# Patient Record
Sex: Male | Born: 2000
Health system: Southern US, Community
[De-identification: ages and names within clinical notes are randomized; demographics above are authoritative.]

## PROBLEM LIST (undated history)

## (undated) DIAGNOSIS — F320 Major depressive disorder, single episode, mild: Secondary | ICD-10-CM

## (undated) DIAGNOSIS — T7840XA Allergy, unspecified, initial encounter: Secondary | ICD-10-CM

## (undated) DIAGNOSIS — J45909 Unspecified asthma, uncomplicated: Secondary | ICD-10-CM

## (undated) DIAGNOSIS — F419 Anxiety disorder, unspecified: Secondary | ICD-10-CM

## (undated) HISTORY — PX: ESOPHAGOGASTRODUODENOSCOPY: SHX1529

## (undated) HISTORY — DX: Anxiety disorder, unspecified: F41.9

## (undated) HISTORY — PX: WISDOM TOOTH EXTRACTION: SHX21

## (undated) HISTORY — DX: Allergy, unspecified, initial encounter: T78.40XA

## (undated) HISTORY — PX: TYMPANOSTOMY TUBE PLACEMENT: SHX32

## (undated) HISTORY — DX: Major depressive disorder, single episode, mild: F32.0

## (undated) HISTORY — PX: NO PAST SURGERIES: SHX2092

---
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
## 2018-02-04 DIAGNOSIS — R5383 Other fatigue: Secondary | ICD-10-CM | POA: Diagnosis not present

## 2018-02-04 DIAGNOSIS — Z113 Encounter for screening for infections with a predominantly sexual mode of transmission: Secondary | ICD-10-CM | POA: Diagnosis not present

## 2018-02-04 DIAGNOSIS — E669 Obesity, unspecified: Secondary | ICD-10-CM | POA: Diagnosis not present

## 2018-02-04 DIAGNOSIS — F331 Major depressive disorder, recurrent, moderate: Secondary | ICD-10-CM | POA: Diagnosis not present

## 2018-02-04 DIAGNOSIS — H6123 Impacted cerumen, bilateral: Secondary | ICD-10-CM | POA: Diagnosis not present

## 2018-02-04 DIAGNOSIS — R030 Elevated blood-pressure reading, without diagnosis of hypertension: Secondary | ICD-10-CM | POA: Diagnosis not present

## 2018-04-29 ENCOUNTER — Encounter (INDEPENDENT_AMBULATORY_CARE_PROVIDER_SITE_OTHER): Payer: Self-pay | Admitting: Nurse Practitioner

## 2018-05-14 DIAGNOSIS — R030 Elevated blood-pressure reading, without diagnosis of hypertension: Secondary | ICD-10-CM | POA: Diagnosis not present

## 2018-05-14 DIAGNOSIS — E559 Vitamin D deficiency, unspecified: Secondary | ICD-10-CM | POA: Diagnosis not present

## 2018-05-14 DIAGNOSIS — Z13220 Encounter for screening for lipoid disorders: Secondary | ICD-10-CM | POA: Diagnosis not present

## 2018-05-14 DIAGNOSIS — F331 Major depressive disorder, recurrent, moderate: Secondary | ICD-10-CM | POA: Diagnosis not present

## 2018-05-14 DIAGNOSIS — E669 Obesity, unspecified: Secondary | ICD-10-CM | POA: Diagnosis not present

## 2018-05-14 DIAGNOSIS — Z713 Dietary counseling and surveillance: Secondary | ICD-10-CM | POA: Diagnosis not present

## 2018-06-15 IMAGING — DX DG RIBS 2V*L*
3 series · 3 of 3 positions shown · non-contrast
Comparison: August 23, 2005

CLINICAL DATA: Left lower anterior rib pain for 2 weeks.

EXAM:
LEFT RIBS - 2 VIEW

[rib pa]
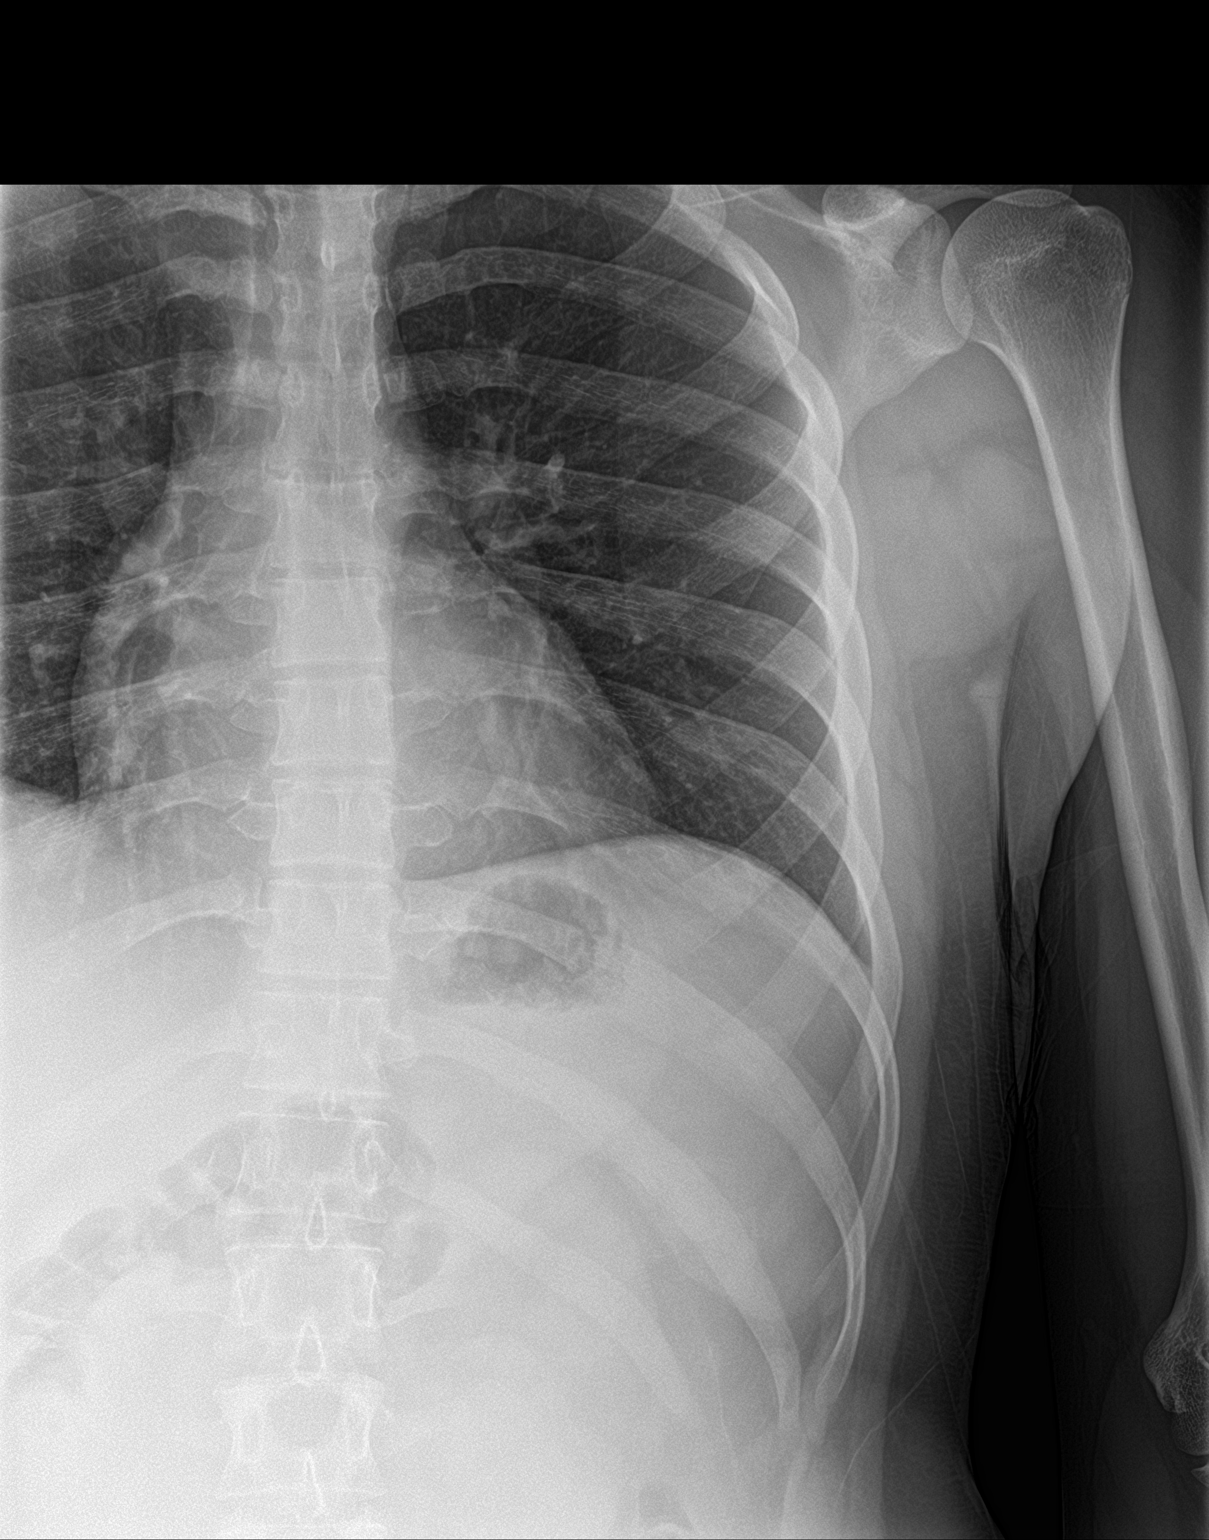

[rib pa obl (1 of 2)]
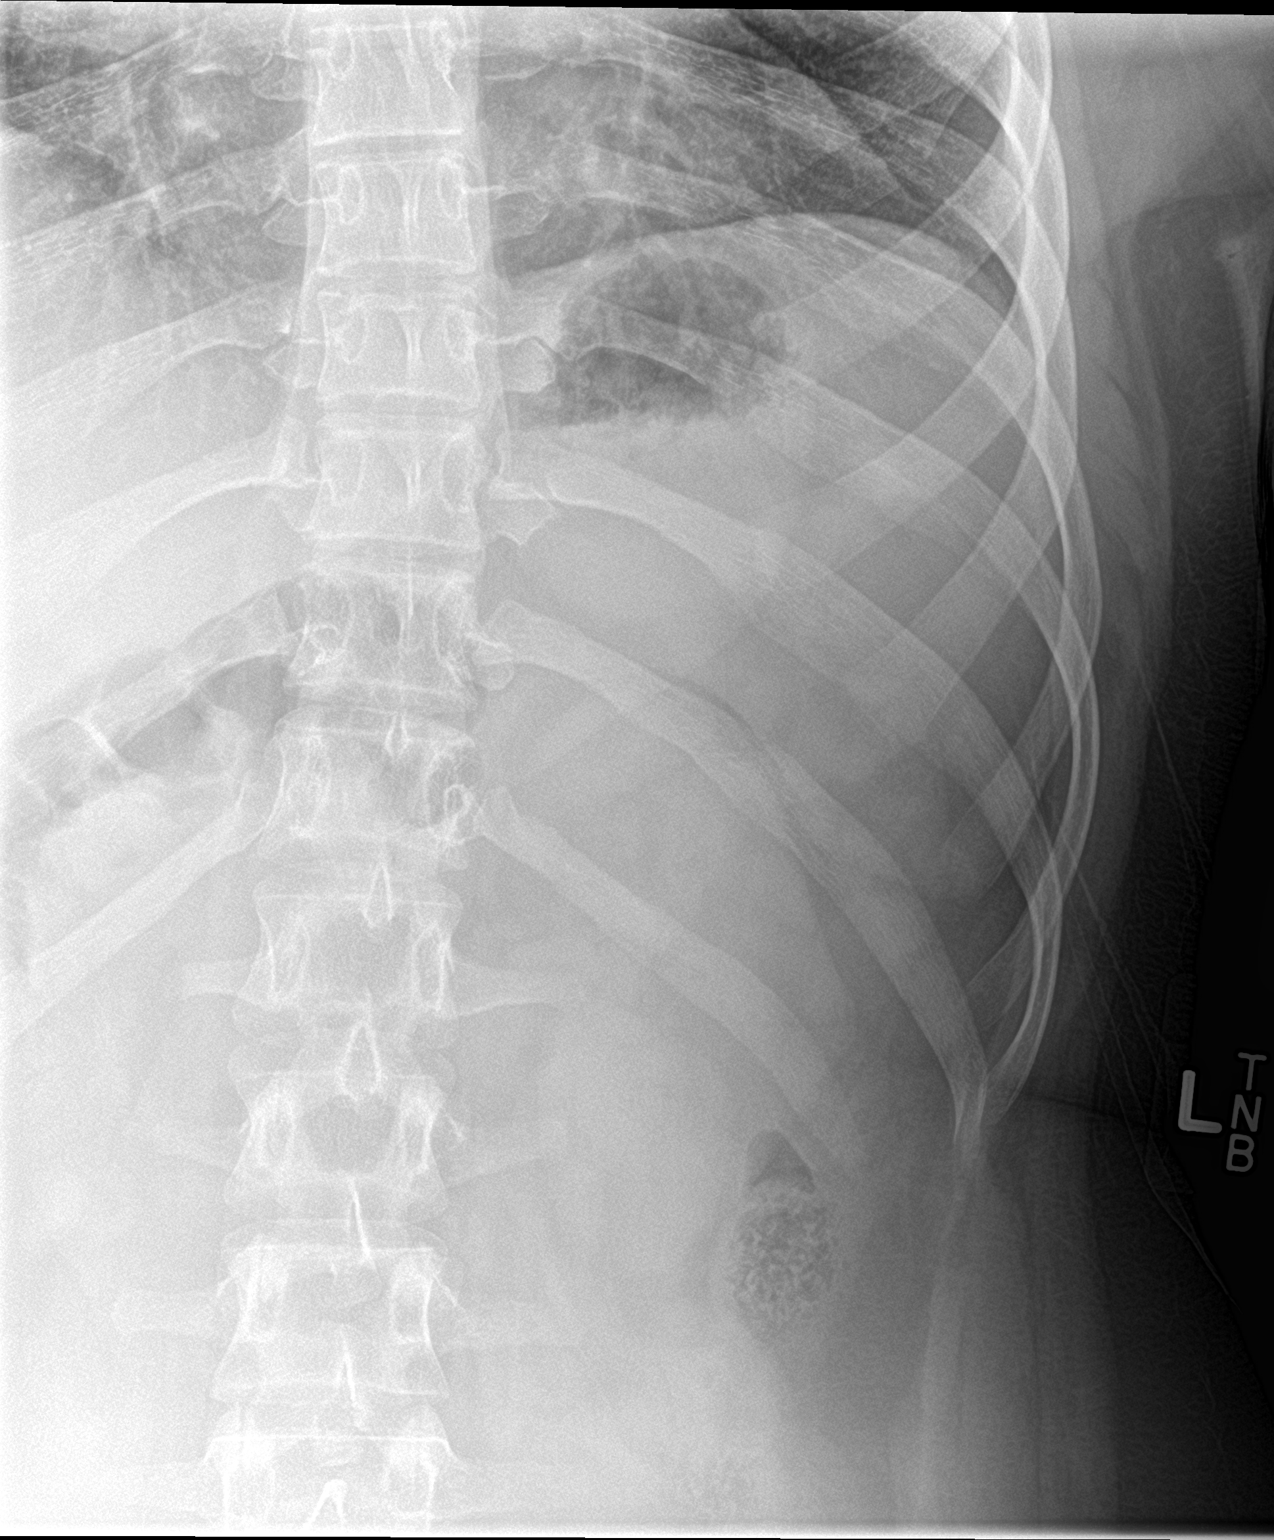

[rib pa obl (2 of 2)]
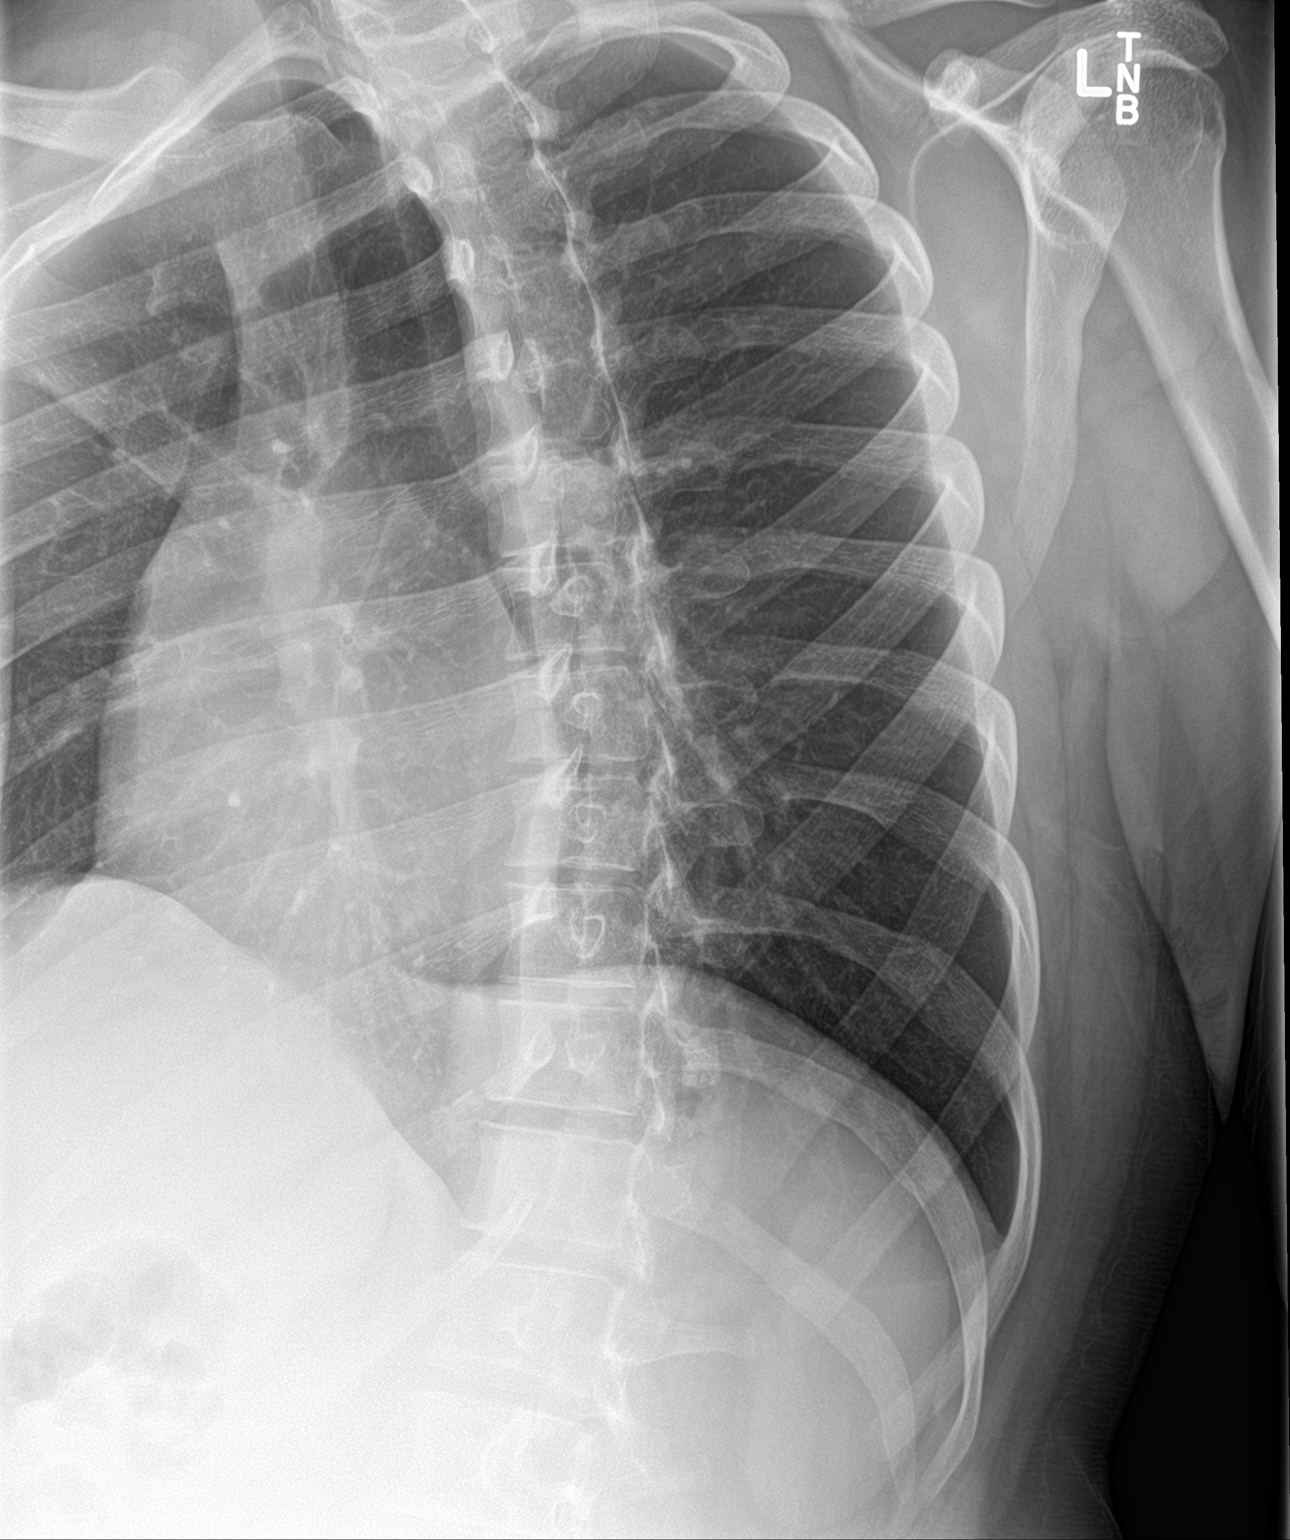

[3 of 3 positions shown; findings below may reference images not displayed]

FINDINGS: No fracture or other bone lesions are seen involving the ribs.
IMPRESSION: Negative.

## 2018-08-13 DIAGNOSIS — E559 Vitamin D deficiency, unspecified: Secondary | ICD-10-CM | POA: Diagnosis not present

## 2018-09-06 ENCOUNTER — Ambulatory Visit: Payer: Medicaid Other | Admitting: Family Medicine

## 2018-09-10 ENCOUNTER — Encounter (INDEPENDENT_AMBULATORY_CARE_PROVIDER_SITE_OTHER): Payer: Self-pay

## 2018-09-10 ENCOUNTER — Ambulatory Visit (INDEPENDENT_AMBULATORY_CARE_PROVIDER_SITE_OTHER): Payer: Self-pay | Admitting: Family Medicine

## 2018-09-10 ENCOUNTER — Other Ambulatory Visit: Payer: Self-pay

## 2018-09-10 ENCOUNTER — Encounter: Payer: Self-pay | Admitting: Family Medicine

## 2018-09-10 VITALS — BP 128/87 | HR 96 | Temp 99.0°F | Resp 12 | Ht 66.0 in | Wt 187.0 lb

## 2018-09-10 DIAGNOSIS — F819 Developmental disorder of scholastic skills, unspecified: Secondary | ICD-10-CM

## 2018-09-10 DIAGNOSIS — T148XXA Other injury of unspecified body region, initial encounter: Secondary | ICD-10-CM

## 2018-09-10 DIAGNOSIS — J3081 Allergic rhinitis due to animal (cat) (dog) hair and dander: Secondary | ICD-10-CM

## 2018-09-10 DIAGNOSIS — K5900 Constipation, unspecified: Secondary | ICD-10-CM

## 2018-09-10 DIAGNOSIS — J4530 Mild persistent asthma, uncomplicated: Secondary | ICD-10-CM

## 2018-09-10 MED ORDER — LORATADINE 10 MG PO TABS: 10.0000 mg | ORAL_TABLET | Freq: Every day | ORAL | 1 refills | Status: DC

## 2018-09-10 MED ORDER — TETANUS-DIPHTH-ACELL PERTUSSIS 5-2.5-18.5 LF-MCG/0.5 IM SUSP
0.5000 mL | Freq: Once | INTRAMUSCULAR | Status: AC
  Administered 2018-09-10: 0.5 mL via INTRAMUSCULAR

## 2018-09-10 MED ORDER — POLYETHYLENE GLYCOL 3350 17 GM/SCOOP PO POWD: 1.0000 | Freq: Once | ORAL | Status: AC

## 2018-09-10 NOTE — Progress Notes (Signed)
Subjective:     Patient ID: Jimmy Landry, male   DOB: 2000/04/23, 18 y.o.   MRN: [id number]  Jimmy Landry presents for New Patient (Initial Visit) (establish care)  Jimmy Landry is a 18 year old male who presents today to reestablish care.  Is present today with his mother.  He is okay to speak with her about his health care and request that she stay in the room.  Has been previous patient of Dr. Lindaann SloughNelson's team.  Recently was seen by Dr. Karilyn CotaGosrani and referred back over here for establishment of care back in [place].  He has been treated for multiple medical conditions including anxiety, depression, asthma and allergies among others.  Currently reports that he is taking stool softeners, Prozac, eardrops for earwax softening, still has inhaler but is not using it frequently maybe once or twice a year.  Stopped taking his allergy shots and is no longer going to an allergist does not want to go back to an allergist.  Reports that he is also not taking an allergy medication.  But possibly would be interested in going back on allergy medication.  Graduated from high school late as he has a learning disability.  He was accommodated while he was going through school.  Did not really have any goals for future endeavors coming out of high school.  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  Reports that his immunizations are up-to-date but that he recently had a fall at work where he landed on metal and needs to get his tetanus shot today.  Would like to get the flu shot in the near future once we have it back in the office.  Family history is significant for asthma, allergies, COPD, lung cancer, depression, diabetes heart disease and others. Denies smoking or alcohol use.  Denies sexual intercourse at this time.  Denies having any drug use illicit and or taking prescription drugs that were not prescribed to him.  Reports living with his mother and older brother.  They have 2 pets, dogs  peanut and raisin.  He enjoys watching TV, video gaming, music.  Reports that he does not have much time for social life.  As he works night shift comes home and sleeps.  Not really been hanging out with.  Reports that he isolates a little bit.  He thinks this is mostly related to his job.  Because he did have friends in school.  Reports that he eats a diet that has vegetables, fruits, meats.  Reports that he is not really been on snacking.  Reports that he drinks 5-hour energy drinks.  1 bottle lasts about 3 days.  Does report that he drinks soda.  Reports that he drinks plenty water which he thinks he gets 8 cups a day.  Reports wearing a seatbelt.  Reports having smoke detectors at home.  Reports not using his phone while driving.  Overall no concerns today in the office.  Just would like to be reestablished with this.   Today patient denies signs and symptoms of COVID 19 infection including fever, chills, cough, shortness of breath, and headache.  Past Medical, Surgical, Social History, Allergies, and Medications have been Reviewed.  Past Medical History:  Diagnosis Date  . Anxiety   . Asthma   . Depression, major, single episode, mild (HCC) 12/21/2016   Past Surgical History:  Procedure Laterality Date  . NO PAST SURGERIES    . TYMPANOSTOMY TUBE PLACEMENT  Social History   Socioeconomic History  . Marital status: Single    Spouse name: Not on file  . Number of children: Not on file  . Years of education: Not on file  . Highest education level: Not on file  Occupational History  . Occupation: Consulting civil engineerstudent  Social Needs  . Financial resource strain: Not on file  . Food insecurity    Worry: Not on file    Inability: Not on file  . Transportation needs    Medical: Not on file    Non-medical: Not on file  Tobacco Use  . Smoking status: Never Smoker  . Smokeless tobacco: Never Used  Substance and Sexual Activity  . Alcohol use: No  . Drug use: No  . Sexual activity: Not  Currently  Lifestyle  . Physical activity    Days per week: Not on file    Minutes per session: Not on file  . Stress: Not on file  Relationships  . Social Musicianconnections    Talks on phone: Not on file    Gets together: Not on file    Attends religious service: Not on file    Active member of club or organization: Not on file    Attends meetings of clubs or organizations: Not on file    Relationship status: Not on file  . Intimate partner violence    Fear of current or ex partner: Not on file    Emotionally abused: Not on file    Physically abused: Not on file    Forced sexual activity: Not on file  Other Topics Concern  . Not on file  Social History Narrative   Lives with parents and older brother Alinda Moneyony    Outpatient Encounter Medications as of 09/10/2018  Medication Sig  . albuterol (VENTOLIN HFA) 108 (90 Base) MCG/ACT inhaler Inhale 2 puffs into the lungs every 4 (four) hours as needed for wheezing or shortness of breath.  . carbamide peroxide (DEBROX) 6.5 % OTIC solution Place 5 drops into both ears 2 (two) times daily.  Marland Kitchen. FLUoxetine (PROZAC) 20 MG capsule Take 60 mg by mouth daily.   Marland Kitchen. VITAMIN D PO Take 10,000 Units by mouth daily.  . [DISCONTINUED] cetirizine (ZYRTEC) 10 MG tablet TAKE ONE TABLET BY MOUTH ONCE DAILY. (Patient not taking: Reported on 09/10/2018)  . [DISCONTINUED] cetirizine (ZYRTEC) 10 MG tablet TAKE ONE TABLET BY MOUTH ONCE DAILY. (Patient not taking: Reported on 09/10/2018)  . [DISCONTINUED] hydrOXYzine (ATARAX/VISTARIL) 25 MG tablet Take 25 mg 3 (three) times daily as needed by mouth.  . [DISCONTINUED] montelukast (SINGULAIR) 10 MG tablet TAKE (1) TABLET BY MOUTH AT BEDTIME. (Patient not taking: Reported on 09/10/2018)   No facility-administered encounter medications on file as of 09/10/2018.    No Known Allergies  Review of Systems  Constitutional: Negative for chills and fever.  HENT: Negative.   Eyes: Negative.   Respiratory: Negative.   Cardiovascular:  Negative.   Gastrointestinal: Negative.   Endocrine: Negative.   Genitourinary: Negative.   Musculoskeletal: Negative.   Skin: Negative.   Allergic/Immunologic: Negative.   Neurological: Negative.   Hematological: Negative.   Psychiatric/Behavioral: Negative.   All other systems reviewed and are negative.      Objective:     BP 128/87   Pulse 96   Temp 99 F (37.2 C) (Oral)   Resp 12   Ht 5\' 6"  (1.676 m)   Wt 187 lb (84.8 kg)   SpO2 96%   BMI 30.18 kg/m  Physical Exam Vitals signs and nursing note reviewed.  Constitutional:      Appearance: Normal appearance. He is well-developed and well-groomed. He is obese.  HENT:     Head: Normocephalic and atraumatic.     Right Ear: External ear normal.     Left Ear: External ear normal.     Nose: Nose normal.     Mouth/Throat:     Mouth: Mucous membranes are moist.     Pharynx: Oropharynx is clear.  Eyes:     General:        Right eye: No discharge.        Left eye: No discharge.     Conjunctiva/sclera: Conjunctivae normal.  Neck:     Musculoskeletal: Normal range of motion and neck supple.  Cardiovascular:     Rate and Rhythm: Normal rate and regular rhythm.     Pulses: Normal pulses.     Heart sounds: Normal heart sounds.  Pulmonary:     Effort: Pulmonary effort is normal.     Breath sounds: Normal breath sounds.  Musculoskeletal: Normal range of motion.  Skin:    General: Skin is warm.  Neurological:     General: No focal deficit present.     Mental Status: He is alert and oriented to person, place, and time.  Psychiatric:        Attention and Perception: Attention normal.        Mood and Affect: Mood normal.        Behavior: Behavior is cooperative.     Comments: Poor eye contact Paucity of speech         Assessment and Plan             1. Constipation, unspecified constipation type Wants to have miralax again as he reports not being "where he needs to be" with  Bathroom use. He takes stool  softeners but the don't help much. Encouraged to drink more water and increase fiber in diet.  Reviewed side effects, risks and benefits of medication.   Patient acknowledged agreement and understanding of the plan.    - polyethylene glycol powder (GLYCOLAX/MIRALAX) container 255 g  2. Chronic allergic rhinitis due to animal hair and dander Advise to start taking allergy medication to make sure he does not have Asthma flares related to uncontrolled allergies. Is not seeing the allergist anymore at this time. Declines wanting to go back.  Reviewed side effects, risks and benefits of medication.   Patient acknowledged agreement and understanding of the plan.    - loratadine (CLARITIN) 10 MG tablet; Take 1 tablet (10 mg total) by mouth daily.  Dispense: 90 tablet; Refill: 1  3. Cut, accidental Fall on metal at work. No signs of infection. Provided with Tdap in office today.  Patient was educated on the recommendation for  Tetanus TDAP vaccine. After obtaining informed consent, the vaccine was administered no adverse effects noted at time of administration. Patient provided with education on arm soreness and use of tylenol or ibuprofen (if safe) for this. Encourage to use the arm vaccine was given in to help reduce the soreness. Patient educated on the signs of a reaction to the vaccine and advised to contact the office should these occur.    - Tdap (BOOSTRIX) injection 0.5 mL  4. Learning disability Graduated HS, is working in a warehouse. Lack of social interaction-delay in communication. Overall is functioning well outside of school at this time.  5. Mild persistent asthma, uncomplicated Controlled, reports not  needing inhaler but a few times a year. Advised to take allergy medications to prevent flares of asthma especially with pets in the home and fall coming. Patient acknowledged agreement and understanding of the plan.     Follow Up: 6 months or as needed  Perlie Mayo, DNP, AGNP-BC [place], [address], [place] [address] Office Hours: Mon-Thurs 8 am-5 pm; Fri 8 am-12 pm Office Phone:  [phone]  Office Fax: [phone]

## 2018-09-10 NOTE — Patient Instructions (Signed)
Thank you for coming into the office today. I appreciate the opportunity to provide you with the care for your health and wellness. Today we discussed: overall health and establish care  Follow Up: 6 months (can call if need sooner)  No labs today  Continue current medications. Please let us know when you need refills.  Start using Miralax daily. Make sure you are getting enough fiber in your diet. Drink plenty of water as well.  Today you have a vaccine, your arm might get sore, you can use ice or heat pad and or tylenol for pain. Using the arm will help reduce the pain.  Please continue to practice social distancing to keep you, your family, and our community safe.  If you must go out, please wear a Mask and practice good handwashing.  WASH YOUR HANDS WELL AND FREQUENTLY. AVOID TOUCHING YOUR FACE, UNLESS YOUR HANDS ARE FRESHLY WASHED.  GET FRESH AIR DAILY. STAY HYDRATED WITH WATER.   It was a pleasure to see you and I look forward to continuing to work together on your health and well-being. Please do not hesitate to call the office if you need care or have questions about your care.  Have a wonderful day and week.  With Gratitude,  Tereasa CoopHannah Adeline Petitfrere, DNP, AGNP-BC   Fiber Content in Foods  See the following list for the dietary fiber content of some common foods. High-fiber foods High-fiber foods contain 4 grams or more (4g or more) of fiber per serving. They include:  Artichoke (fresh) - 1 medium has 10.3g of fiber.  Baked beans, plain or vegetarian (canned) -  cup has 5.2g of fiber.  Blackberries or raspberries (fresh) -  cup has 4g of fiber.  Bran cereal -  cup has 8.6g of fiber.  Bulgur (cooked) -  cup has 4g of fiber.  Kidney beans (canned) -  cup has 6.8g of fiber.  Lentils (cooked) -  cup has 7.8g of fiber.  Pear (fresh) - 1 medium has 5.1g of fiber.  Peas (frozen) -  cup has 4.4g of fiber.  Pinto beans (canned) -  cup has 5.5g of fiber.   Pinto beans (dried and cooked) -  cup has 7.7g of fiber.  Potato with skin (baked) - 1 medium has 4.4g of fiber.  Quinoa (cooked) -  cup has 5g of fiber.  Soybeans (canned, frozen, or fresh) -  cup has 5.1g of fiber. Moderate-fiber foods Moderate-fiber foods contain 1-4 grams (1-4g) of fiber per serving. They include:  Almonds - 1 oz. has 3.5g of fiber.  Apple with skin - 1 medium has 3.3g of fiber.  Applesauce, sweetened -  cup has 1.5g of fiber.  Bagel, plain - one 4-inch (10-cm) bagel has 2g of fiber.  Banana - 1 medium has 3.1g of fiber.  Broccoli (cooked) -  cup has 2.5g of fiber.  Carrots (cooked) -  cup has 2.3g of fiber.  Corn (canned or frozen) -  cup has 2.1g of fiber.  Corn tortilla - one 6-inch (15-cm) tortilla has 1.5g of fiber.  Green beans (canned) -  cup has 2g of fiber.  Instant oatmeal -  cup has about 2g of fiber.  Long-grain brown rice (cooked) - 1 cup has 3.5g of fiber.  Macaroni, enriched (cooked) - 1 cup has 2.5g of fiber.  Melon - 1 cup has 1.4g of fiber.  Multigrain cereal -  cup has about 2-4g of fiber.  Orange - 1 small has 3.1g of  fiber.  Potatoes, mashed -  cup has 1.6g of fiber.  Raisins - 1/4 cup has 1.6g of fiber.  Squash -  cup has 2.9g of fiber.  Sunflower seeds -  cup has 1.1g of fiber.  Tomato - 1 medium has 1.5g of fiber.  Vegetable or soy patty - 1 has 3.4g of fiber.  Whole-wheat bread - 1 slice has 2g of fiber.  Whole-wheat spaghetti -  cup has 3.2g of fiber. Low-fiber foods Low-fiber foods contain less than 1 gram (less than 1g) of fiber per serving. They include:  Egg - 1 large.  Flour tortilla - one 6-inch (15-cm) tortilla.  Fruit juice -  cup.  Lettuce - 1 cup.  Meat, poultry, or fish - 1 oz.  Milk - 1 cup.  Spinach (raw) - 1 cup.  White bread - 1 slice.  White rice -  cup.  Yogurt -  cup. Actual amounts of fiber in foods may be different depending on processing. Talk with your  dietitian about how much fiber you need in your diet. This information is not intended to replace advice given to you by your health care provider. Make sure you discuss any questions you have with your health care provider. Document Released: 06/11/2006 Document Revised: 09/16/2015 Document Reviewed: 03/18/2015 Elsevier Patient Education  2020 [employer].

## 2018-10-07 ENCOUNTER — Telehealth: Payer: Self-pay | Admitting: Family Medicine

## 2018-10-07 ENCOUNTER — Other Ambulatory Visit: Payer: Self-pay

## 2018-10-07 MED ORDER — FLUOXETINE HCL 20 MG PO CAPS: 60.0000 mg | ORAL_CAPSULE | Freq: Every day | ORAL | 0 refills | Status: DC

## 2018-10-07 NOTE — Telephone Encounter (Signed)
Needs Prozac sent in to [facility]--

## 2018-10-07 NOTE — Telephone Encounter (Signed)
Medication refilled

## 2018-11-11 ENCOUNTER — Ambulatory Visit (INDEPENDENT_AMBULATORY_CARE_PROVIDER_SITE_OTHER): Payer: Self-pay | Admitting: Internal Medicine

## 2018-11-15 ENCOUNTER — Other Ambulatory Visit: Payer: Self-pay

## 2018-11-15 DIAGNOSIS — Z20822 Contact with and (suspected) exposure to covid-19: Secondary | ICD-10-CM

## 2018-11-15 DIAGNOSIS — Z20828 Contact with and (suspected) exposure to other viral communicable diseases: Secondary | ICD-10-CM | POA: Diagnosis not present

## 2018-11-16 LAB — NOVEL CORONAVIRUS, NAA: SARS-CoV-2, NAA: NOT DETECTED

## 2018-11-18 ENCOUNTER — Telehealth: Payer: Self-pay | Admitting: Family Medicine

## 2018-11-18 NOTE — Telephone Encounter (Signed)
Patient is calling to receive his negative COVID results. Patient expressed understanding. °

## 2018-12-18 ENCOUNTER — Encounter: Payer: Self-pay | Admitting: Family Medicine

## 2018-12-18 ENCOUNTER — Ambulatory Visit: Payer: BC Managed Care – PPO | Admitting: Family Medicine

## 2018-12-18 ENCOUNTER — Other Ambulatory Visit: Payer: Self-pay

## 2018-12-18 VITALS — BP 126/68 | HR 97 | Temp 98.6°F | Resp 15 | Ht 66.0 in | Wt 183.1 lb

## 2018-12-18 DIAGNOSIS — H6123 Impacted cerumen, bilateral: Secondary | ICD-10-CM | POA: Diagnosis not present

## 2018-12-18 DIAGNOSIS — Z23 Encounter for immunization: Secondary | ICD-10-CM

## 2018-12-18 DIAGNOSIS — H9203 Otalgia, bilateral: Secondary | ICD-10-CM

## 2018-12-18 MED ORDER — CARBAMIDE PEROXIDE 6.5 % OT SOLN: 5.0000 [drp] | Freq: Two times a day (BID) | OTIC | 1 refills | Status: DC

## 2018-12-18 NOTE — Patient Instructions (Addendum)
I appreciate the opportunity to provide you with care for your health and wellness. Today we discussed: ear pain/ear wax build up  Follow up:   Appt next Tuesday   No labs   Referral for ear doctor made today. If you get in with them before next week appt with me,  you can cancel the appt with me.  Debrox daily for the next 4-5 days. Do not push anything into ears. After wearing ear plugs, use debrox.  Let ear drain naturally  I hope you have a wonderful, happy, safe, and healthy Holiday Season!  Please continue to practice social distancing to keep you, your family, and our community safe.  If you must go out, please wear a mask and practice good handwashing.  It was a pleasure to see you and I look forward to continuing to work together on your health and well-being. Please do not hesitate to call the office if you need care or have questions about your care.  Have a wonderful day and week. With Gratitude, Cherly Beach, DNP, AGNP-BC    Carbamide Peroxide ear solution What is this medicine? CARBAMIDE PEROXIDE (CAR bah mide per OX ide) is used to soften and help remove ear wax. This medicine may be used for other purposes; ask your health care provider or pharmacist if you have questions. COMMON BRAND NAME(S): Auro Ear, Auro Earache Relief, Debrox, Ear Drops, Ear Wax Removal, Ear Wax Remover, Earwax Treatment, Murine, Thera-Ear What should I tell my health care provider before I take this medicine? They need to know if you have any of these conditions:  dizziness  ear discharge  ear pain, irritation or rash  infection  perforated eardrum (hole in eardrum)  an unusual or allergic reaction to carbamide peroxide, glycerin, hydrogen peroxide, other medicines, foods, dyes, or preservatives  pregnant or trying to get pregnant  breast-feeding How should I use this medicine? This medicine is only for use in the outer ear canal. Follow the directions carefully. Wash  hands before and after use. The solution may be warmed by holding the bottle in the hand for 1 to 2 minutes. Lie with the affected ear facing upward. Place the proper number of drops into the ear canal. After the drops are instilled, remain lying with the affected ear upward for 5 minutes to help the drops stay in the ear canal. A cotton ball may be gently inserted at the ear opening for no longer than 5 to 10 minutes to ensure retention. Repeat, if necessary, for the opposite ear. Do not touch the tip of the dropper to the ear, fingertips, or other surface. Do not rinse the dropper after use. Keep container tightly closed. Talk to your pediatrician regarding the use of this medicine in children. While this drug may be used in children as young as 12 years for selected conditions, precautions do apply. Overdosage: If you think you have taken too much of this medicine contact a poison control center or emergency room at once. NOTE: This medicine is only for you. Do not share this medicine with others. What if I miss a dose? If you miss a dose, use it as soon as you can. If it is almost time for your next dose, use only that dose. Do not use double or extra doses. What may interact with this medicine? Interactions are not expected. Do not use any other ear products without asking your doctor or health care professional. This list may not describe all possible interactions.  Give your health care provider a list of all the medicines, herbs, non-prescription drugs, or dietary supplements you use. Also tell them if you smoke, drink alcohol, or use illegal drugs. Some items may interact with your medicine. What should I watch for while using this medicine? This medicine is not for long-term use. Do not use for more than 4 days without checking with your health care professional. Contact your doctor or health care professional if your condition does not start to get better within a few days or if you notice  burning, redness, itching or swelling. What side effects may I notice from receiving this medicine? Side effects that you should report to your doctor or health care professional as soon as possible:  allergic reactions like skin rash, itching or hives, swelling of the face, lips, or tongue  burning, itching, and redness  worsening ear pain  rash Side effects that usually do not require medical attention (report to your doctor or health care professional if they continue or are bothersome):  abnormal sensation while putting the drops in the ear  temporary reduction in hearing (but not complete loss of hearing) This list may not describe all possible side effects. Call your doctor for medical advice about side effects. You may report side effects to FDA at 1-800-FDA-1088. Where should I keep my medicine? Keep out of the reach of children. Store at room temperature between 15 and 30 degrees C (59 and 86 degrees F) in a tight, light-resistant container. Keep bottle away from excessive heat and direct sunlight. Throw away any unused medicine after the expiration date. NOTE: This sheet is a summary. It may not cover all possible information. If you have questions about this medicine, talk to your doctor, pharmacist, or health care provider.  2020 Elsevier/Gold Standard (2007-05-07 14:00:02)

## 2018-12-18 NOTE — Progress Notes (Signed)
Subjective:     Patient ID: Jimmy Landry, male   DOB: April 08, 2000, 18 y.o.   MRN: [id number]  Jimmy Landry presents for Ear Pain (right ear)  Jimmy Landry is a 18 year old male patient with allergies, anxiety, asthma, major depression.  Presents today with right ear squeaking.  Reported it started about 6 to 7 months ago.  Mostly on the right side.  It squeaks when he rubs it or itches it.  Nothing is relieved that nothing aggravates it.  He reports that it is pretty constant.  When it does hurt it is a 3 out of 10.  He wears hearing plugs daily for work.  Used to use Debrox to help keep his ears clean but has not been using it because he did not think it was very helpful.  Has had to have his ears flushed in the past.  Reports that there is some itching which is having noticed that the squeaking was occurring.  Some mild hearing loss on both sides.  Predominantly the right side bothers him most.  Today patient denies signs and symptoms of COVID 19 infection including fever, chills, cough, shortness of breath, and headache.  Past Medical, Surgical, Social History, Allergies, and Medications have been Reviewed.  Past Medical History:  Diagnosis Date  . Allergy   . Anxiety   . Asthma   . Depression, major, single episode, mild ([place]) 12/21/2016   Past Surgical History:  Procedure Laterality Date  . ESOPHAGOGASTRODUODENOSCOPY     for penny removal   . NO PAST SURGERIES    . TYMPANOSTOMY TUBE PLACEMENT    . WISDOM TOOTH EXTRACTION     Social History   Socioeconomic History  . Marital status: Single    Spouse name: Not on file  . Number of children: Not on file  . Years of education: Not on file  . Highest education level: High school graduate  Occupational History  . Occupation: Ship broker  Social Needs  . Financial resource strain: Not hard at all  . Food insecurity    Worry: Never true    Inability: Never true  . Transportation needs    Medical: No    Non-medical: No   Tobacco Use  . Smoking status: Never Smoker  . Smokeless tobacco: Never Used  Substance and Sexual Activity  . Alcohol use: No  . Drug use: No  . Sexual activity: Not Currently  Lifestyle  . Physical activity    Days per week: 0 days    Minutes per session: 0 min  . Stress: Not at all  Relationships  . Social Herbalist on phone: Once a week    Gets together: Never    Attends religious service: Never    Active member of club or organization: No    Attends meetings of clubs or organizations: Never    Relationship status: Never married  . Intimate partner violence    Fear of current or ex partner: No    Emotionally abused: No    Physically abused: No    Forced sexual activity: No  Other Topics Concern  . Not on file  Social History Narrative   Lives mom and older brother Jimmy Landry   Peanut-weenie/chi   Raisin-weenie/chi      Enjoy: watching tv, video games, music      Diet: veggies, fruits, meat-chicken, some red meat. Not big on snacks   Caffeine: 5 hour energy drinks for work (one bottle for  3 days), coke, sprite   Water: 8 cups day       Wears seat belt    Wears sun protection   Smoke detectors at home   Does not use phone while driving    Outpatient Encounter Medications as of 12/18/2018  Medication Sig  . albuterol (VENTOLIN HFA) 108 (90 Base) MCG/ACT inhaler Inhale 2 puffs into the lungs every 4 (four) hours as needed for wheezing or shortness of breath.  . carbamide peroxide (DEBROX) 6.5 % OTIC solution Place 5 drops into both ears 2 (two) times daily.  Marland Kitchen. FLUoxetine (PROZAC) 20 MG capsule Take 3 capsules (60 mg total) by mouth daily.  Marland Kitchen. loratadine (CLARITIN) 10 MG tablet Take 1 tablet (10 mg total) by mouth daily.  Marland Kitchen. VITAMIN D PO Take 10,000 Units by mouth daily.   Facility-Administered Encounter Medications as of 12/18/2018  Medication  . polyethylene glycol powder (GLYCOLAX/MIRALAX) container 255 g   No Known Allergies  Review of Systems   Constitutional: Negative for chills and fever.  HENT: Negative.        Loss of hearing-"rat sound in ear" makes a lot of squeaking  Eyes: Negative.   Respiratory: Negative.   Cardiovascular: Negative.   Gastrointestinal: Negative.   Endocrine: Negative.   Genitourinary: Negative.   Musculoskeletal: Negative.   Skin: Negative.   Allergic/Immunologic: Negative.   Neurological: Negative.   Hematological: Negative.   Psychiatric/Behavioral: Negative.   All other systems reviewed and are negative.      Objective:     BP 126/68   Pulse 97   Temp 98.6 F (37 C) (Oral)   Resp 15   Ht 5\' 6"  (1.676 m)   Wt 183 lb 1.3 oz (83 kg)   SpO2 97%   BMI 29.55 kg/m   Physical Exam Vitals signs and nursing note reviewed.  Constitutional:      Appearance: Normal appearance. He is well-developed and well-groomed. He is obese.  HENT:     Head: Normocephalic and atraumatic.     Right Ear: External ear normal. Decreased hearing noted. Tenderness present. There is impacted cerumen.     Left Ear: External ear normal. Decreased hearing noted. Tenderness present. There is impacted cerumen.     Ears:     Comments: Erythematous canals bilaterally    Nose: Nose normal.     Mouth/Throat:     Mouth: Mucous membranes are moist.     Pharynx: Oropharynx is clear.  Eyes:     General:        Right eye: No discharge.        Left eye: No discharge.     Conjunctiva/sclera: Conjunctivae normal.  Neck:     Musculoskeletal: Normal range of motion and neck supple.  Cardiovascular:     Rate and Rhythm: Normal rate and regular rhythm.     Pulses: Normal pulses.     Heart sounds: Normal heart sounds.  Pulmonary:     Effort: Pulmonary effort is normal.     Breath sounds: Normal breath sounds.  Musculoskeletal: Normal range of motion.  Skin:    General: Skin is warm.  Neurological:     General: No focal deficit present.     Mental Status: He is alert and oriented to person, place, and time.   Psychiatric:        Attention and Perception: Attention normal.        Mood and Affect: Mood normal.        Speech: Speech  normal.        Behavior: Behavior normal. Behavior is cooperative.        Thought Content: Thought content normal.        Cognition and Memory: Cognition normal.        Judgment: Judgment normal.   Procedure: Bilateral ear flushing.  Consent obtained verbally. Both ears are impacted with cerumen.  Flushed with irrigation of 60 mL syringe with warm water and peroxide diluted.  Success with removal of some cerumen no inability to disimpact closest to tympanic membrane.  Both canals are red and tender.  Hearing slightly improved post irrigation.     Assessment and Plan        1. Bilateral impacted cerumen Significant impaction bilaterally. Uses ear plugs, has not been using Debrox as ordered, did not think it helped. Encouraged to go back to using it daily and come back in 1 week to see if wax as come out on its own or if we can flush it out at that time. Referral for ENT made as well to help with suction if flushing can not clear the canal fully.  Appreciate collaboration, please let PCP know if you need anything from me.  - Ambulatory referral to ENT - carbamide peroxide (DEBROX) 6.5 % OTIC solution; Place 5 drops into both ears 2 (two) times daily.  Dispense: 15 mL; Refill: 1  2. Ear pain, bilateral Mostly from impaction, relieved some, but still impacted closet to TM.  3. Hearing loss of both ears due to cerumen impaction Improved some post flush, but still no visual of TM.  4. Need for immunization against influenza Patient was educated on the recommendation for flu vaccine. After obtaining informed consent, the vaccine was administered no adverse effects noted at time of administration. Patient provided with education on arm soreness and use of tylenol or ibuprofen (if safe) for this. Encourage to use the arm vaccine was given in to help reduce the soreness.  Patient educated on the signs of a reaction to the vaccine and advised to contact the office should these occur.   - Flu Vaccine QUAD 36+ mos IM   Follow Up: 12/26/2018   Freddy Finner, DNP, AGNP-BC [facility] [address], [address] [place], [place] [address] Office Hours: Mon-Thurs 8 am-5 pm; Fri 8 am-12 pm Office Phone:  [phone]  Office Fax: [phone]

## 2018-12-26 ENCOUNTER — Encounter: Payer: Self-pay | Admitting: Family Medicine

## 2018-12-26 ENCOUNTER — Other Ambulatory Visit: Payer: Self-pay

## 2018-12-26 ENCOUNTER — Ambulatory Visit: Payer: BC Managed Care – PPO | Admitting: Family Medicine

## 2018-12-26 VITALS — BP 125/76 | HR 86 | Temp 98.5°F | Resp 15 | Ht 66.0 in | Wt 186.1 lb

## 2018-12-26 DIAGNOSIS — L239 Allergic contact dermatitis, unspecified cause: Secondary | ICD-10-CM | POA: Diagnosis not present

## 2018-12-26 DIAGNOSIS — H6123 Impacted cerumen, bilateral: Secondary | ICD-10-CM

## 2018-12-26 MED ORDER — TRIAMCINOLONE ACETONIDE 0.1 % EX CREA: 1.0000 "application " | TOPICAL_CREAM | Freq: Two times a day (BID) | CUTANEOUS | 0 refills | Status: DC

## 2018-12-26 NOTE — Patient Instructions (Addendum)
  I appreciate the opportunity to provide you with care for your health and wellness. Today we discussed: Earwax buildup, rash  Follow up: 2 weeks in office  No labs or referrals today  I sent in a cream to be applied twice daily to the thumbs and forearm to help clear up the rash.  Wear long sleeves and if you get wet immediately wash the area that becomes saturated with any fluid.  Continue to use Debrox daily.   You can use a small half capful of peroxide weekly to help with the buildup of wax. Avoid sticking anything into the ear canal.  I hope you have a wonderful, happy, safe, and healthy Holiday Season!  Please continue to practice social distancing to keep you, your family, and our community safe.  If you must go out, please wear a mask and practice good handwashing.  It was a pleasure to see you and I look forward to continuing to work together on your health and well-being. Please do not hesitate to call the office if you need care or have questions about your care.  Have a wonderful day and week. With Gratitude, Cherly Beach, DNP, AGNP-BC

## 2018-12-26 NOTE — Progress Notes (Signed)
Subjective:     Patient ID: Jimmy Landry, male   DOB: Jun 05, 2000, 18 y.o.   MRN: [id number]  Jimmy Landry presents for Ear Pain (follow up ear pain has gotten better. ) Jimmy Landry is a 18 year old male patient with allergies, anxiety, asthma, major depression.   Presents today for follow-up regarding bilateral cerumen impaction.  Saw him back on November 11,2020: At that time Jimmy Landry reported about 6 to 7 months prior Jimmy Landry was having a lot of squeaking when Jimmy Landry rubs or itch his ear. Nothing is relieved that nothing aggravates it.    Jimmy Landry was noted to have bilateral buildup of significant cerumen impaction to the complete length of the ear canal.  Has history of placing things in his ears when Jimmy Landry was little had tubes placed in his ears when Jimmy Landry was little.  Has had to have his ears flushed multiple times throughout his life.I did a cerumen impaction removal in the office bilaterally.  With some success sent home with Debrox to use daily and follow-up in 1 week for repeat flushing if needed as well as a referral to ENT for possible need for suction.  Today reports it is gotten much better Jimmy Landry can hear better.  Not having any squeaking since the last visit.  Has been using Debrox as ordered.   Of note Jimmy Landry also reports that Jimmy Landry has been exposed to some type of chemical or allergen at work.  Jimmy Landry has a rash on both arms and the right forearm.  Today patient denies signs and symptoms of COVID 19 infection including fever, chills, cough, shortness of breath, and headache.  Past Medical, Surgical, Social History, Allergies, and Medications have been [place]  Past Medical History:  Diagnosis Date  . Allergy   . Anxiety   . Asthma   . Depression, major, single episode, mild ([facility]) 12/21/2016   Past Surgical History:  Procedure Laterality Date  . ESOPHAGOGASTRODUODENOSCOPY     for penny removal   . NO PAST SURGERIES    . TYMPANOSTOMY TUBE PLACEMENT    . WISDOM TOOTH EXTRACTION     Social History    Socioeconomic History  . Marital status: Single    Spouse name: Not on file  . Number of children: Not on file  . Years of education: Not on file  . Highest education level: High school graduate  Occupational History  . Occupation: Consulting civil engineerstudent  Social Needs  . Financial resource strain: Not hard at all  . Food insecurity    Worry: Never true    Inability: Never true  . Transportation needs    Medical: No    Non-medical: No  Tobacco Use  . Smoking status: Never Smoker  . Smokeless tobacco: Never Used  Substance and Sexual Activity  . Alcohol use: No  . Drug use: No  . Sexual activity: Not Currently  Lifestyle  . Physical activity    Days per week: 0 days    Minutes per session: 0 min  . Stress: Not at all  Relationships  . Social Musicianconnections    Talks on phone: Once a week    Gets together: Never    Attends religious service: Never    Active member of club or organization: No    Attends meetings of clubs or organizations: Never    Relationship status: Never married  . Intimate partner violence    Fear of current or ex partner: No    Emotionally abused: No  Physically abused: No    Forced sexual activity: No  Other Topics Concern  . Not on file  Social History Narrative   Lives mom and older brother Jimmy Landry   Peanut-weenie/chi   Raisin-weenie/chi      Enjoy: watching tv, video games, music      Diet: veggies, fruits, meat-chicken, some red meat. Not big on snacks   Caffeine: 5 hour energy drinks for work (one bottle for 3 days), coke, sprite   Water: 8 cups day       Wears seat belt    Wears sun protection   Smoke detectors at home   Does not use phone while driving    Outpatient Encounter Medications as of 12/26/2018  Medication Sig  . albuterol (VENTOLIN HFA) 108 (90 Base) MCG/ACT inhaler Inhale 2 puffs into the lungs every 4 (four) hours as needed for wheezing or shortness of breath.  . carbamide peroxide (DEBROX) 6.5 % OTIC solution Place 5 drops into both  ears 2 (two) times daily.  Marland Kitchen FLUoxetine (PROZAC) 20 MG capsule Take 3 capsules (60 mg total) by mouth daily.  Marland Kitchen loratadine (CLARITIN) 10 MG tablet Take 1 tablet (10 mg total) by mouth daily.  Marland Kitchen VITAMIN D PO Take 10,000 Units by mouth daily.  Marland Kitchen triamcinolone cream (KENALOG) 0.1 % Apply 1 application topically 2 (two) times daily.   Facility-Administered Encounter Medications as of 12/26/2018  Medication  . polyethylene glycol powder (GLYCOLAX/MIRALAX) container 255 g   No Known Allergies  Review of Systems  Constitutional: Negative for chills and fever.  HENT: Negative.   Eyes: Negative.   Respiratory: Negative.   Cardiovascular: Negative.   Gastrointestinal: Negative.   Endocrine: Negative.   Genitourinary: Negative.   Musculoskeletal: Negative.   Skin: Negative.   Allergic/Immunologic: Negative.   Neurological: Negative.   Hematological: Negative.   Psychiatric/Behavioral: Negative.   All other systems reviewed and are negative.      Objective:     BP 125/76   Pulse 86   Temp 98.5 F (36.9 C) (Oral)   Resp 15   Ht 5\' 6"  (1.676 m)   Wt 186 lb 1.9 oz (84.4 kg)   SpO2 98%   BMI 30.04 kg/m   Physical Exam Vitals signs and nursing note reviewed.  Constitutional:      Appearance: Normal appearance. Jimmy Landry is well-developed and well-groomed. Jimmy Landry is obese.  HENT:     Head: Normocephalic and atraumatic.     Right Ear: Hearing and external ear normal. There is impacted cerumen. Tympanic membrane is scarred.     Left Ear: Hearing and external ear normal. There is impacted cerumen.     Ears:     Comments: Bilateral erythema of canals. Inability to see tympanic membrane prior to flushing. Post flushing right tympanic membrane appears to be slightly scarred. Still no ability to see tympanic membrane of the left at this time.  Secondary to cerebral buildup.    Nose: Nose normal.     Mouth/Throat:     Mouth: Mucous membranes are moist.     Pharynx: Oropharynx is clear.   Eyes:     General:        Right eye: No discharge.        Left eye: No discharge.     Conjunctiva/sclera: Conjunctivae normal.  Neck:     Musculoskeletal: Normal range of motion and neck supple.  Cardiovascular:     Rate and Rhythm: Normal rate and regular rhythm.  Pulses: Normal pulses.     Heart sounds: Normal heart sounds.  Pulmonary:     Effort: Pulmonary effort is normal.     Breath sounds: Normal breath sounds.  Musculoskeletal: Normal range of motion.  Skin:    General: Skin is warm.     Findings: Rash present. Rash is urticarial.     Comments: Noted dermatitis of bilateral thumbs and right anterior forearm.  Neurological:     General: No focal deficit present.     Mental Status: Jimmy Landry is alert and oriented to person, place, and time.  Psychiatric:        Attention and Perception: Attention normal.        Mood and Affect: Mood normal.        Speech: Speech normal.        Behavior: Behavior normal. Behavior is cooperative.        Thought Content: Thought content normal.        Cognition and Memory: Cognition normal.        Judgment: Judgment normal.        Assessment and Plan        1. Dermal hypersensitivity reaction Noted hypersensitivity reaction/dermatitis of both thumbs and right anterior forearm prescribed Kenalog for application twice daily.  Encouraged to wash hands and or body parts that come in contact with chemicals as this can be dangerous.   - triamcinolone cream (KENALOG) 0.1 %; Apply 1 application topically 2 (two) times daily.  Dispense: 30 g; Refill: 0  2. Bilateral impacted cerumen Significant improvement in bilateral impaction of cerumen.  Still was unable to see both tympanic membranes was able to visualize the right.  Follow-up in 2 weeks to check to see if the left will clear after being flushed.  Continue use of Debrox at this time.  Currently still has follow-up with ENT.  Follow-up: 2 weeks in office       Freddy Finner, DNP, AGNP-BC  [facility] [address] [place], [place] [address] Office Hours: Mon-Thurs 8 am-5 pm; Fri 8 am-12 pm Office Phone:  [phone]  Office Fax: [phone]

## 2019-01-10 ENCOUNTER — Other Ambulatory Visit: Payer: Self-pay

## 2019-01-10 ENCOUNTER — Encounter: Payer: Self-pay | Admitting: Family Medicine

## 2019-01-10 ENCOUNTER — Ambulatory Visit: Payer: BC Managed Care – PPO | Admitting: Family Medicine

## 2019-01-10 VITALS — BP 120/82 | HR 83 | Temp 98.5°F | Resp 15 | Ht 66.0 in | Wt 185.0 lb

## 2019-01-10 DIAGNOSIS — H6122 Impacted cerumen, left ear: Secondary | ICD-10-CM | POA: Insufficient documentation

## 2019-01-10 DIAGNOSIS — H6123 Impacted cerumen, bilateral: Secondary | ICD-10-CM

## 2019-01-10 NOTE — Progress Notes (Signed)
Subjective:     Patient ID: Jimmy Landry, male   DOB: 23-Dec-2000, 18 y.o.   MRN: [id number]  Jimmy Landry presents for bilateral impacted cerumen (2 week follow up)  Jimmy Landry is a 18 year old male patient with allergies, anxiety, asthma, major depression.   Presents today for follow-up regarding bilateral cerumen impaction.  Saw him back on November 11,2020: At that time he reported about 6 to 7 months prior he was having a lot of squeaking when he rubs or itch his ear.Nothing is relieved that nothing aggravates it.   He was noted to have bilateral buildup of significant cerumen impaction to the complete length of the ear canal.  Has history of placing things in his ears when he was little had tubes placed in his ears when he was little.  Has had to have his ears flushed multiple times throughout his life.I did a cerumen impaction removal in the office bilaterally.  With some success sent home with Debrox to use daily and follow-up in 1 week for repeat flushing if needed as well as a referral to ENT for possible need for suction.  Follow up on 12/26/2018:  He reported it is gotten much better he can hear better.  Not having any squeaking since the last visit.  Has been using Debrox as ordered.   Today: Improved, no issues with hearing, pain, or squeaking. Continues to use Debrox. Still has ENT follow up.  Today patient denies signs and symptoms of COVID 19 infection including fever, chills, cough, shortness of breath, and headache.  Past Medical, Surgical, Social History, Allergies, and Medications have been [place]   Past Medical History:  Diagnosis Date  . Allergy   . Anxiety   . Asthma   . Depression, major, single episode, mild (HCC) 12/21/2016   Past Surgical History:  Procedure Laterality Date  . ESOPHAGOGASTRODUODENOSCOPY     for penny removal   . NO PAST SURGERIES    . TYMPANOSTOMY TUBE PLACEMENT    . WISDOM TOOTH EXTRACTION     Social History    Socioeconomic History  . Marital status: Single    Spouse name: Not on file  . Number of children: Not on file  . Years of education: Not on file  . Highest education level: High school graduate  Occupational History  . Occupation: Consulting civil engineer  Social Needs  . Financial resource strain: Not hard at all  . Food insecurity    Worry: Never true    Inability: Never true  . Transportation needs    Medical: No    Non-medical: No  Tobacco Use  . Smoking status: Never Smoker  . Smokeless tobacco: Never Used  Substance and Sexual Activity  . Alcohol use: No  . Drug use: No  . Sexual activity: Not Currently  Lifestyle  . Physical activity    Days per week: 0 days    Minutes per session: 0 min  . Stress: Not at all  Relationships  . Social Musician on phone: Once a week    Gets together: Never    Attends religious service: Never    Active member of club or organization: No    Attends meetings of clubs or organizations: Never    Relationship status: Never married  . Intimate partner violence    Fear of current or ex partner: No    Emotionally abused: No    Physically abused: No    Forced sexual activity: No  Other Topics Concern  . Not on file  Social History Narrative   Lives mom and older brother Jimmy Landry   Peanut-weenie/chi   Raisin-weenie/chi      Enjoy: watching tv, video games, music      Diet: veggies, fruits, meat-chicken, some red meat. Not big on snacks   Caffeine: 5 hour energy drinks for work (one bottle for 3 days), coke, sprite   Water: 8 cups day       Wears seat belt    Wears sun protection   Smoke detectors at home   Does not use phone while driving    Outpatient Encounter Medications as of 01/10/2019  Medication Sig  . albuterol (VENTOLIN HFA) 108 (90 Base) MCG/ACT inhaler Inhale 2 puffs into the lungs every 4 (four) hours as needed for wheezing or shortness of breath.  . carbamide peroxide (DEBROX) 6.5 % OTIC solution Place 5 drops into both  ears 2 (two) times daily.  Marland Kitchen. FLUoxetine (PROZAC) 20 MG capsule Take 3 capsules (60 mg total) by mouth daily.  Marland Kitchen. loratadine (CLARITIN) 10 MG tablet Take 1 tablet (10 mg total) by mouth daily.  Marland Kitchen. triamcinolone cream (KENALOG) 0.1 % Apply 1 application topically 2 (two) times daily.  Marland Kitchen. VITAMIN D PO Take 10,000 Units by mouth daily.   Facility-Administered Encounter Medications as of 01/10/2019  Medication  . polyethylene glycol powder (GLYCOLAX/MIRALAX) container 255 g   No Known Allergies  Review of Systems  Constitutional: Negative for chills and fever.  HENT: Negative.   Eyes: Negative.   Respiratory: Negative.   Cardiovascular: Negative.   Gastrointestinal: Negative.   Endocrine: Negative.   Genitourinary: Negative.   Musculoskeletal: Negative.   Skin: Negative.   Allergic/Immunologic: Negative.   Neurological: Negative.   Hematological: Negative.   Psychiatric/Behavioral: Negative.   All other systems reviewed and are negative.     Objective:     BP 120/82   Pulse 83   Temp 98.5 F (36.9 C) (Oral)   Resp 15   Ht 5\' 6"  (1.676 m)   Wt 185 lb (83.9 kg)   SpO2 97%   BMI 29.86 kg/m   Physical Exam Vitals signs and nursing note reviewed.  Constitutional:      Appearance: Normal appearance. He is well-developed and well-groomed. He is obese.  HENT:     Head: Normocephalic and atraumatic.     Right Ear: Hearing, tympanic membrane, ear canal and external ear normal.     Left Ear: Hearing, ear canal and external ear normal. Left ear bulging TM: TM appear normal in both ears, there still appears to be build up of skin or wax in Left-poor visual of TM still on that side.     Ears:     Comments: Bilateral canals are clear      Nose: Nose normal.     Mouth/Throat:     Mouth: Mucous membranes are moist.     Pharynx: Oropharynx is clear.  Eyes:     General:        Right eye: No discharge.        Left eye: No discharge.     Conjunctiva/sclera: Conjunctivae normal.   Neck:     Musculoskeletal: Normal range of motion and neck supple.  Cardiovascular:     Rate and Rhythm: Normal rate and regular rhythm.     Pulses: Normal pulses.     Heart sounds: Normal heart sounds.  Pulmonary:     Effort: Pulmonary effort is normal.  Breath sounds: Normal breath sounds.  Musculoskeletal: Normal range of motion.  Skin:    General: Skin is warm.  Neurological:     General: No focal deficit present.     Mental Status: He is alert and oriented to person, place, and time.  Psychiatric:        Attention and Perception: Attention normal.        Mood and Affect: Mood normal.        Speech: Speech normal.        Behavior: Behavior normal. Behavior is cooperative.        Thought Content: Thought content normal.        Cognition and Memory: Cognition normal.        Judgment: Judgment normal.        Assessment and Plan       1. Bilateral impacted cerumen Continued, significant improvement in bilateral impaction of cerumen.  Was able to see tympanic membranes more on Right than Left. Keep appt with ENT, continue use of Debrox at this time. Declined any additional flushing in office today  Follow up: as needed  Perlie Mayo, DNP, AGNP-BC [address] [place], [place] [address] Office Hours: Mon-Thurs 8 am-5 pm; Fri 8 am-12 pm Office Phone:  [phone]  Office Fax: [phone]

## 2019-01-10 NOTE — Patient Instructions (Signed)
  I appreciate the opportunity to provide you with care for your health and wellness. Today we discussed: follow up on ear wax  Follow up: as needed  No labs or referrals today  Keep appt with ENT. Left ear is still showing some build up. Continue Debrox after using ear plugs.  I hope you have a wonderful, happy, safe, and healthy Holiday Season! See you in the New Year :)  Please continue to practice social distancing to keep you, your family, and our community safe.  If you must go out, please wear a mask and practice good handwashing.  It was a pleasure to see you and I look forward to continuing to work together on your health and well-being. Please do not hesitate to call the office if you need care or have questions about your care.  Have a wonderful day and week. With Gratitude, Cherly Beach, DNP, AGNP-BC

## 2019-02-10 ENCOUNTER — Other Ambulatory Visit (INDEPENDENT_AMBULATORY_CARE_PROVIDER_SITE_OTHER): Payer: Self-pay | Admitting: Nurse Practitioner

## 2019-02-12 ENCOUNTER — Telehealth: Payer: Self-pay | Admitting: *Deleted

## 2019-02-12 ENCOUNTER — Other Ambulatory Visit: Payer: Self-pay

## 2019-02-12 MED ORDER — FLUOXETINE HCL 20 MG PO CAPS: 60.0000 mg | ORAL_CAPSULE | Freq: Every day | ORAL | 0 refills | Status: DC

## 2019-02-12 NOTE — Telephone Encounter (Signed)
Pt needs prozac sent to [facility] he is out of the medication

## 2019-02-12 NOTE — Telephone Encounter (Signed)
Med refilled.

## 2019-03-13 ENCOUNTER — Ambulatory Visit: Payer: BC Managed Care – PPO | Admitting: Family Medicine

## 2019-03-13 ENCOUNTER — Encounter: Payer: Self-pay | Admitting: Family Medicine

## 2019-03-13 ENCOUNTER — Other Ambulatory Visit: Payer: Self-pay

## 2019-03-13 VITALS — BP 110/70 | HR 75 | Temp 98.4°F | Resp 15 | Ht 66.0 in | Wt 184.0 lb

## 2019-03-13 DIAGNOSIS — B369 Superficial mycosis, unspecified: Secondary | ICD-10-CM | POA: Diagnosis not present

## 2019-03-13 DIAGNOSIS — H6123 Impacted cerumen, bilateral: Secondary | ICD-10-CM

## 2019-03-13 DIAGNOSIS — F320 Major depressive disorder, single episode, mild: Secondary | ICD-10-CM | POA: Diagnosis not present

## 2019-03-13 DIAGNOSIS — J3081 Allergic rhinitis due to animal (cat) (dog) hair and dander: Secondary | ICD-10-CM | POA: Diagnosis not present

## 2019-03-13 DIAGNOSIS — H6240 Otitis externa in other diseases classified elsewhere, unspecified ear: Secondary | ICD-10-CM

## 2019-03-13 HISTORY — DX: Otitis externa in other diseases classified elsewhere, unspecified ear: B36.9

## 2019-03-13 MED ORDER — ACETIC ACID 2 % OT SOLN: 4.0000 [drp] | Freq: Three times a day (TID) | OTIC | 0 refills | Status: DC

## 2019-03-13 NOTE — Assessment & Plan Note (Signed)
Controlled not currently taking any medication for this. Reports he will probably started medication back in March when the season kicks back up.

## 2019-03-13 NOTE — Assessment & Plan Note (Signed)
Currently resolved. Does have what appears to be yeast in both ears but no wax. Advised for him not to use the Debrox anymore since the wax is gone.

## 2019-03-13 NOTE — Assessment & Plan Note (Signed)
Fungal infection bilateral ears. Needs to help get it dried out post using acetic acid drops will have him maybe get swimmer eardrops to help keep the ears dry dried out.

## 2019-03-13 NOTE — Patient Instructions (Addendum)
Happy New Year! May you have a year filled with hope, love, happiness and laughter.  I appreciate the opportunity to provide you with care for your health and wellness. Today we discussed: itching ears and mood and allergies   Follow up: 6 months   No labs or referrals today  I will send in ear drops if the insurance covers please use these. If they are not covered call the office and let me know.   Please continue to practice social distancing to keep you, your family, and our community safe.  If you must go out, please wear a mask and practice good handwashing.  It was a pleasure to see you and I look forward to continuing to work together on your health and well-being. Please do not hesitate to call the office if you need care or have questions about your care.  Have a wonderful day and week. With Gratitude, Tereasa Coop, DNP, AGNP-BC

## 2019-03-13 NOTE — Progress Notes (Signed)
Subjective:  Patient ID: Jimmy Landry, male    DOB: 10-Jun-2000  Age: 19 y.o. MRN: [id number]  CC:  Chief Complaint  Patient presents with  . impacted cerumen    follow up      HPI  HPI  Jimmy Landry is a 19 year old male patient who established with me last year. Reports today for 60-month follow-up in addition to focus on cerumen impaction bilaterally. Reports that he is doing well and has not had any earwax buildup that he knows of. Is still wearing earplugs at work. But has been using the Debrox to help. Has missed some days doing it but overall reports that he is hearing better and feels better. He reports that he does have some itching in both the ears but no pain. No signs or symptoms of allergies or infection.  Reports taking his Prozac and denies having any signs of symptoms of depression.  Today patient denies signs and symptoms of COVID 19 infection including fever, chills, cough, shortness of breath, and headache. Past Medical, Surgical, Social History, Allergies, and Medications have been Reviewed.   Past Medical History:  Diagnosis Date  . Allergy   . Anxiety   . Asthma   . Depression, major, single episode, mild (HCC) 12/21/2016    Current Meds  Medication Sig  . albuterol (VENTOLIN HFA) 108 (90 Base) MCG/ACT inhaler Inhale 2 puffs into the lungs every 4 (four) hours as needed for wheezing or shortness of breath.  . carbamide peroxide (DEBROX) 6.5 % OTIC solution Place 5 drops into both ears 2 (two) times daily.  Marland Kitchen FLUoxetine (PROZAC) 20 MG capsule Take 3 capsules (60 mg total) by mouth daily.  Marland Kitchen loratadine (CLARITIN) 10 MG tablet Take 1 tablet (10 mg total) by mouth daily.  Marland Kitchen triamcinolone cream (KENALOG) 0.1 % Apply 1 application topically 2 (two) times daily.  Marland Kitchen VITAMIN D PO Take 10,000 Units by mouth daily.   Current Facility-Administered Medications for the 03/13/19 encounter (Office Visit) with Perlie Mayo, NP  Medication  . polyethylene glycol  powder (GLYCOLAX/MIRALAX) container 255 g    ROS:  Review of Systems  Constitutional: Negative.   HENT: Negative.        Ears are itchy but can hear well.  Eyes: Negative.   Respiratory: Negative.   Cardiovascular: Negative.   Gastrointestinal: Negative.   Genitourinary: Negative.   Musculoskeletal: Negative.   Skin: Negative.   Neurological: Negative.   Endo/Heme/Allergies: Negative.   Psychiatric/Behavioral: Negative.   All other systems reviewed and are negative.    Objective:   Today's Vitals: BP 110/70   Pulse 75   Temp 98.4 F (36.9 C) (Temporal)   Resp 15   Ht 5\' 6"  (1.676 m)   Wt 184 lb 0.6 oz (83.5 kg)   SpO2 97%   BMI 29.70 kg/m  Vitals with BMI 03/13/2019 01/10/2019 12/26/2018  Height 5\' 6"  5\' 6"  5\' 6"   Weight 184 lbs 1 oz 185 lbs 186 lbs 2 oz  BMI 29.72 86.76 19.50  Systolic 932 671 245  Diastolic 70 82 76  Pulse 75 83 86     Physical Exam Vitals and nursing note reviewed.  Constitutional:      Appearance: Normal appearance. He is well-developed, well-groomed and overweight.  HENT:     Head: Normocephalic and atraumatic.     Comments: Mask in place    Right Ear: Hearing and external ear normal.     Left Ear:  Hearing and external ear normal.     Ears:     Comments:  yeast near the tympanic membrane on both sides. Eyes:     General:        Right eye: No discharge.        Left eye: No discharge.     Conjunctiva/sclera: Conjunctivae normal.  Cardiovascular:     Rate and Rhythm: Normal rate and regular rhythm.     Pulses: Normal pulses.     Heart sounds: Normal heart sounds.  Pulmonary:     Effort: Pulmonary effort is normal.     Breath sounds: Normal breath sounds.  Musculoskeletal:        General: Normal range of motion.     Cervical back: Normal range of motion and neck supple.  Skin:    General: Skin is warm.  Neurological:     General: No focal deficit present.     Mental Status: He is alert and oriented to person, place, and time.    Psychiatric:        Attention and Perception: Attention and perception normal.        Mood and Affect: Mood and affect normal.        Speech: Speech normal.        Behavior: Behavior normal. Behavior is cooperative.        Thought Content: Thought content normal.    Depression screen [facility] 2/9 03/13/2019 01/10/2019 12/26/2018  Decreased Interest 0 0 0  Down, Depressed, Hopeless 0 0 0  PHQ - 2 Score 0 0 0     Assessment   1. Depression, major, single episode, mild (HCC)   2. Otic fungal infection   3. Bilateral impacted cerumen   4. Chronic allergic rhinitis due to animal hair and dander     Tests ordered No orders of the defined types were placed in this encounter.    Plan: Please see assessment and plan per problem list above.   Meds ordered this encounter  Medications  . acetic acid 2 % otic solution    Sig: Place 4 drops into both ears 3 (three) times daily. Do this for 7 days.    Dispense:  15 mL    Refill:  0    Order Specific Question:   Supervising Provider    Answer:   Kerri Perches [[id number]]    Patient to follow-up in 6 months  Jimmy Finner, NP

## 2019-03-13 NOTE — Assessment & Plan Note (Signed)
Continued use of Prozac doing well with this. Is not having signs of symptoms of depression at this time.

## 2019-03-19 ENCOUNTER — Other Ambulatory Visit: Payer: Self-pay

## 2019-03-19 ENCOUNTER — Telehealth: Payer: Self-pay

## 2019-03-19 MED ORDER — FLUOXETINE HCL 20 MG PO CAPS: 60.0000 mg | ORAL_CAPSULE | Freq: Every day | ORAL | 3 refills | Status: DC

## 2019-03-19 NOTE — Telephone Encounter (Signed)
Medication refilled w/ 3 refills and sent to pharmacy

## 2019-03-19 NOTE — Telephone Encounter (Signed)
Please call in FLUoxetine (PROZAC) 20 MG capsule, and please place refills on this --if you cant please advise why   Call Toniann Fail -Mom

## 2019-08-04 ENCOUNTER — Other Ambulatory Visit: Payer: Self-pay

## 2019-08-04 ENCOUNTER — Telehealth: Payer: Self-pay

## 2019-08-04 MED ORDER — FLUOXETINE HCL 20 MG PO CAPS: 60.0000 mg | ORAL_CAPSULE | Freq: Every day | ORAL | 0 refills | Status: DC

## 2019-08-04 NOTE — Telephone Encounter (Signed)
Refilled sent in  

## 2019-08-04 NOTE — Telephone Encounter (Signed)
Patient needs refill for FLUoxetine (PROZAC) 20 MG capsule   mother requesting refill be called today

## 2019-09-05 ENCOUNTER — Other Ambulatory Visit: Payer: Self-pay | Admitting: Family Medicine

## 2019-09-10 ENCOUNTER — Encounter: Payer: Self-pay | Admitting: Family Medicine

## 2019-09-10 ENCOUNTER — Other Ambulatory Visit: Payer: Self-pay | Admitting: *Deleted

## 2019-09-10 ENCOUNTER — Ambulatory Visit: Payer: BC Managed Care – PPO | Admitting: Family Medicine

## 2019-09-10 ENCOUNTER — Other Ambulatory Visit: Payer: Self-pay

## 2019-09-10 VITALS — BP 120/82 | HR 71 | Temp 97.5°F | Resp 18 | Ht 66.0 in | Wt 191.4 lb

## 2019-09-10 DIAGNOSIS — H6122 Impacted cerumen, left ear: Secondary | ICD-10-CM

## 2019-09-10 DIAGNOSIS — F320 Major depressive disorder, single episode, mild: Secondary | ICD-10-CM | POA: Diagnosis not present

## 2019-09-10 DIAGNOSIS — Z7185 Encounter for immunization safety counseling: Secondary | ICD-10-CM

## 2019-09-10 DIAGNOSIS — Z7189 Other specified counseling: Secondary | ICD-10-CM | POA: Diagnosis not present

## 2019-09-10 MED ORDER — FLUOXETINE HCL 20 MG PO CAPS: 60.0000 mg | ORAL_CAPSULE | Freq: Every day | ORAL | 1 refills | Status: DC

## 2019-09-10 MED ORDER — ALBUTEROL SULFATE HFA 108 (90 BASE) MCG/ACT IN AERS: 2.0000 | INHALATION_SPRAY | RESPIRATORY_TRACT | 3 refills | Status: AC | PRN

## 2019-09-10 NOTE — Assessment & Plan Note (Signed)
Doing very well on Prozac.  Denies having any SI or HI.  PHQ 0

## 2019-09-10 NOTE — Assessment & Plan Note (Signed)
Education about the COVID-19 virus itself provided in the office today.

## 2019-09-10 NOTE — Assessment & Plan Note (Signed)
Was resolved but has stopped using Debrox.  Left side is partially dried out would probably benefit from using the Debrox educated on using this and follow-up as needed.

## 2019-09-10 NOTE — Patient Instructions (Signed)
I appreciate the opportunity to provide you with care for your health and wellness. Today we discussed: overall health and COVID vaccine  Follow up: 6 months   No labs or referrals today  Glad you are doing well overall.  Continue to use the ear drops once or twice a week.  Please get COVID vaccine as soon as you can.  Call if you have issues.  Please continue to practice social distancing to keep you, your family, and our community safe.  If you must go out, please wear a mask and practice good handwashing.  It was a pleasure to see you and I look forward to continuing to work together on your health and well-being. Please do not hesitate to call the office if you need care or have questions about your care.  Have a wonderful day and week. With Gratitude, Tereasa Coop, DNP, AGNP-BC

## 2019-09-10 NOTE — Progress Notes (Signed)
Subjective:  Patient ID: Jimmy Landry, male    DOB: Dec 19, 2000  Age: 19 y.o. MRN: [id number]  CC:  Chief Complaint  Patient presents with  . Follow-up    no other issues at the moment       HPI  HPI   Jimmy Landry is a 19 year old male patient who presents today for follow-up.  Overall doing well does not have any complaints.  Did ask about getting the Covid vaccine and whether or not it causes him to be sterile he was very worried about that because he had heard back in the news.  Would like to get several months refill on his Prozac as well.  Reports ears are doing good and he is not having any muffled hearing.  Today patient denies signs and symptoms of COVID 19 infection including fever, chills, cough, shortness of breath, and headache. Past Medical, Surgical, Social History, Allergies, and Medications have been Reviewed.   Past Medical History:  Diagnosis Date  . Allergy   . Anxiety   . Asthma   . Depression, major, single episode, mild (HCC) 12/21/2016  . Insomnia 12/21/2016  . Otic fungal infection 03/13/2019    Current Meds  Medication Sig  . acetic acid 2 % otic solution Place 4 drops into both ears 3 (three) times daily. Do this for 7 days.  . carbamide peroxide (DEBROX) 6.5 % OTIC solution Place 5 drops into both ears 2 (two) times daily.  Marland Kitchen loratadine (CLARITIN) 10 MG tablet Take 1 tablet (10 mg total) by mouth daily.  Marland Kitchen triamcinolone cream (KENALOG) 0.1 % Apply 1 application topically 2 (two) times daily.  Marland Kitchen VITAMIN D PO Take 10,000 Units by mouth daily.  . [DISCONTINUED] albuterol (VENTOLIN HFA) 108 (90 Base) MCG/ACT inhaler Inhale 2 puffs into the lungs every 4 (four) hours as needed for wheezing or shortness of breath.  . [DISCONTINUED] FLUoxetine (PROZAC) 20 MG capsule TAKE 3 CAPSULES BY MOUTH ONCE DAILY.   Current Facility-Administered Medications for the 09/10/19 encounter (Office Visit) with Freddy Finner, NP  Medication  . polyethylene glycol powder  (GLYCOLAX/MIRALAX) container 255 g    ROS:  Review of Systems  Constitutional: Negative.   HENT: Negative.   Eyes: Negative.   Respiratory: Negative.   Cardiovascular: Negative.   Gastrointestinal: Negative.   Genitourinary: Negative.   Musculoskeletal: Negative.   Skin: Negative.   Neurological: Negative.   Endo/Heme/Allergies: Negative.   Psychiatric/Behavioral: Negative.   All other systems reviewed and are negative.    Objective:   Today's Vitals: BP 120/82 (BP Location: Right Arm, Patient Position: Sitting, Cuff Size: Normal)   Pulse 71   Temp (!) 97.5 F (36.4 C) (Temporal)   Resp 18   Ht 5\' 6"  (1.676 m)   Wt 191 lb 6.4 oz (86.8 kg)   SpO2 97%   BMI 30.89 kg/m  Vitals with BMI 09/10/2019 03/13/2019 01/10/2019  Height 5\' 6"  5\' 6"  5\' 6"   Weight 191 lbs 6 oz 184 lbs 1 oz 185 lbs  BMI 30.91 29.72 29.87  Systolic 120 110 14/05/2018  Diastolic 82 70 82  Pulse 71 75 83     Physical Exam Vitals and nursing note reviewed.  Constitutional:      Appearance: Normal appearance. He is well-developed and well-groomed. He is obese.  HENT:     Head: Normocephalic and atraumatic.     Right Ear: Hearing, tympanic membrane, ear canal and external ear normal.     Left  Ear: Hearing and external ear normal. There is impacted cerumen.     Mouth/Throat:     Comments: Mask in place Eyes:     General:        Right eye: No discharge.        Left eye: No discharge.     Conjunctiva/sclera: Conjunctivae normal.  Cardiovascular:     Rate and Rhythm: Normal rate and regular rhythm.     Pulses: Normal pulses.     Heart sounds: Normal heart sounds.  Pulmonary:     Effort: Pulmonary effort is normal.     Breath sounds: Normal breath sounds.  Musculoskeletal:        General: Normal range of motion.     Cervical back: Normal range of motion and neck supple.  Skin:    General: Skin is warm.  Neurological:     General: No focal deficit present.     Mental Status: He is alert and oriented to  person, place, and time.  Psychiatric:        Attention and Perception: Attention normal.        Mood and Affect: Mood normal.        Speech: Speech normal.        Behavior: Behavior normal. Behavior is cooperative.        Thought Content: Thought content normal.        Cognition and Memory: Cognition normal.        Judgment: Judgment normal.    Depression screen [facility] 2/9 09/10/2019 03/13/2019 01/10/2019 12/26/2018 12/18/2018  Decreased Interest 0 0 0 0 0  Down, Depressed, Hopeless 0 0 0 0 0  PHQ - 2 Score 0 0 0 0 0  Altered sleeping 0 - - - -  Tired, decreased energy 0 - - - -  Change in appetite 0 - - - -  Feeling bad or failure about yourself  0 - - - -  Trouble concentrating 0 - - - -  Moving slowly or fidgety/restless 0 - - - -  Suicidal thoughts 0 - - - -  PHQ-9 Score 0 - - - -  Difficult doing work/chores Not difficult at all - - - -     Assessment   1. Depression, major, single episode, mild (HCC)   2. Educated about COVID-19 virus infection   3. Vaccine counseling   4. Impacted cerumen of left ear     Tests ordered No orders of the defined types were placed in this encounter.    Plan: Please see assessment and plan per problem list above.   No orders of the defined types were placed in this encounter.   Patient to follow-up in Visit date not found  Freddy Finner, NP

## 2019-09-10 NOTE — Assessment & Plan Note (Signed)
Extensive education regarding Covid vaccine provided today in the office.

## 2019-11-06 ENCOUNTER — Other Ambulatory Visit: Payer: Self-pay | Admitting: Family Medicine

## 2019-12-15 ENCOUNTER — Other Ambulatory Visit: Payer: Self-pay

## 2019-12-15 MED ORDER — FLUOXETINE HCL 20 MG PO CAPS
60.0000 mg | ORAL_CAPSULE | Freq: Every day | ORAL | 1 refills | Status: DC
Start: 2019-12-15 — End: 2020-02-17

## 2020-02-17 ENCOUNTER — Other Ambulatory Visit: Payer: Self-pay | Admitting: Family Medicine

## 2020-03-05 ENCOUNTER — Other Ambulatory Visit: Payer: BC Managed Care – PPO

## 2020-03-05 DIAGNOSIS — Z20822 Contact with and (suspected) exposure to covid-19: Secondary | ICD-10-CM | POA: Diagnosis not present

## 2020-03-06 LAB — SARS-COV-2, NAA 2 DAY TAT

## 2020-03-06 LAB — NOVEL CORONAVIRUS, NAA: SARS-CoV-2, NAA: NOT DETECTED

## 2020-03-08 ENCOUNTER — Telehealth: Payer: Self-pay | Admitting: Family Medicine

## 2020-03-08 NOTE — Telephone Encounter (Signed)
Pt is aware covid test is neg on 03-08-2020

## 2020-03-10 ENCOUNTER — Other Ambulatory Visit: Payer: Self-pay

## 2020-03-10 ENCOUNTER — Telehealth (INDEPENDENT_AMBULATORY_CARE_PROVIDER_SITE_OTHER): Payer: BC Managed Care – PPO | Admitting: Family Medicine

## 2020-03-10 DIAGNOSIS — Z1159 Encounter for screening for other viral diseases: Secondary | ICD-10-CM | POA: Diagnosis not present

## 2020-03-10 DIAGNOSIS — F320 Major depressive disorder, single episode, mild: Secondary | ICD-10-CM

## 2020-03-10 DIAGNOSIS — Z13220 Encounter for screening for lipoid disorders: Secondary | ICD-10-CM

## 2020-03-10 DIAGNOSIS — J4530 Mild persistent asthma, uncomplicated: Secondary | ICD-10-CM

## 2020-03-10 DIAGNOSIS — E785 Hyperlipidemia, unspecified: Secondary | ICD-10-CM

## 2020-03-10 DIAGNOSIS — Z79899 Other long term (current) drug therapy: Secondary | ICD-10-CM

## 2020-03-10 MED ORDER — FLUOXETINE HCL 20 MG PO CAPS: 60.0000 mg | ORAL_CAPSULE | Freq: Every day | ORAL | 3 refills | Status: DC

## 2020-03-10 NOTE — Progress Notes (Signed)
Virtual Visit via Telephone Note  I connected with Jimmy Landry on 03/10/20 at  8:20 AM EST by telephone and verified that I am speaking with the correct person using two identifiers.  Location: Patient: home Provider: work  I discussed the limitations, risks, security and privacy concerns of performing an evaluation and management service by telephone and the availability of in person appointments. I also discussed with the patient that there may be a patient responsible charge related to this service. The patient expressed understanding and agreed to proceed.   History of Present Illness: F/U chronic problems and address any new or current concerns. Review and update medications and allergies. Review recent lab and radiologic data . Update routine health maintainace. Review an encourage improved health habits to include nutrition, exercise and  sleep .  Denies recent fever or chills. Denies sinus pressure, nasal congestion, ear pain or sore throat. Denies chest congestion, productive cough or wheezing. Denies chest pains, palpitations and leg swelling Denies abdominal pain, nausea, vomiting,diarrhea or constipation.   Denies dysuria, frequency, hesitancy or incontinence. Denies joint pain, swelling and limitation in mobility. Denies headaches, seizures, numbness, or tingling. Denies depression, anxiety or insomnia. Denies skin break down or rash.       Observations/Objective:  There were no vitals taken for this visit. Good communication with no confusion and intact memory. Alert and oriented x 3 No signs of respiratory distress during speech   Assessment and Plan: Depression, major, single episode, mild (HCC) Controlled, no change in medication   Mild persistent asthma, uncomplicated Controlled, no change in medication   Hyperlipidemia LDL goal <100 Hyperlipidemia:needs to lower fat intake  Lipid Panel  Lab Results  Component Value Date   CHOL 218 (H)  03/11/2020   HDL 49 03/11/2020   LDLCALC 156 (H) 03/11/2020   TRIG 73 03/11/2020   CHOLHDL 4.4 03/11/2020         Follow Up Instructions:    I discussed the assessment and treatment plan with the patient. The patient was provided an opportunity to ask questions and all were answered. The patient agreed with the plan and demonstrated an understanding of the instructions.   The patient was advised to call back or seek an in-person evaluation if the symptoms worsen or if the condition fails to improve as anticipated.  I provided 14  minutes of non-face-to-face time during this encounter.   [name], MD

## 2020-03-10 NOTE — Patient Instructions (Addendum)
F/U in 4 months, call if you need us sooner  Please come in the morning for your flu vaccine  Please get CBC, cmp and EGFR, lipid panel, hep C screen and HIV test in the morning  It is important that you exercise regularly at least 30 minutes 5 times a week. If you develop chest pain, have severe difficulty breathing, or feel very tired, stop exercising immediately and seek medical attention    Thanks for choosing [facility], we consider it a privelige to serve you.  

## 2020-03-11 ENCOUNTER — Ambulatory Visit (INDEPENDENT_AMBULATORY_CARE_PROVIDER_SITE_OTHER): Payer: BC Managed Care – PPO

## 2020-03-11 ENCOUNTER — Other Ambulatory Visit: Payer: Self-pay

## 2020-03-11 DIAGNOSIS — Z79899 Other long term (current) drug therapy: Secondary | ICD-10-CM | POA: Diagnosis not present

## 2020-03-11 DIAGNOSIS — Z23 Encounter for immunization: Secondary | ICD-10-CM

## 2020-03-11 DIAGNOSIS — Z1159 Encounter for screening for other viral diseases: Secondary | ICD-10-CM | POA: Diagnosis not present

## 2020-03-11 DIAGNOSIS — Z13220 Encounter for screening for lipoid disorders: Secondary | ICD-10-CM | POA: Diagnosis not present

## 2020-03-12 LAB — CMP14+EGFR
ALT: 20 IU/L (ref 0–44)
AST: 20 IU/L (ref 0–40)
Albumin/Globulin Ratio: 1.8 (ref 1.2–2.2)
Albumin: 4.6 g/dL (ref 4.1–5.2)
Alkaline Phosphatase: 89 IU/L (ref 44–121)
BUN/Creatinine Ratio: 16 (ref 9–20)
BUN: 12 mg/dL (ref 6–20)
Bilirubin Total: 0.3 mg/dL (ref 0.0–1.2)
CO2: 22 mmol/L (ref 20–29)
Calcium: 9.7 mg/dL (ref 8.7–10.2)
Chloride: 100 mmol/L (ref 96–106)
Creatinine, Ser: 0.75 mg/dL — ABNORMAL LOW (ref 0.76–1.27)
GFR calc Af Amer: 152 mL/min/{1.73_m2} (ref >59)
GFR calc non Af Amer: 131 mL/min/{1.73_m2} (ref >59)
Globulin, Total: 2.5 g/dL (ref 1.5–4.5)
Glucose: 97 mg/dL (ref 65–99)
Potassium: 3.9 mmol/L (ref 3.5–5.2)
Sodium: 137 mmol/L (ref 134–144)
Total Protein: 7.1 g/dL (ref 6.0–8.5)

## 2020-03-12 LAB — CBC WITH DIFFERENTIAL
Basophils Absolute: 0 10*3/uL (ref 0.0–0.2)
Basos: 0 %
EOS (ABSOLUTE): 0.2 10*3/uL (ref 0.0–0.4)
Eos: 3 %
Hematocrit: 44.2 % (ref 37.5–51.0)
Hemoglobin: 14.9 g/dL (ref 13.0–17.7)
Immature Grans (Abs): 0 10*3/uL (ref 0.0–0.1)
Immature Granulocytes: 0 %
Lymphocytes Absolute: 2.1 10*3/uL (ref 0.7–3.1)
Lymphs: 32 %
MCH: 28.4 pg (ref 26.6–33.0)
MCHC: 33.7 g/dL (ref 31.5–35.7)
MCV: 84 fL (ref 79–97)
Monocytes Absolute: 0.5 10*3/uL (ref 0.1–0.9)
Monocytes: 7 %
Neutrophils Absolute: 3.8 10*3/uL (ref 1.4–7.0)
Neutrophils: 58 %
RBC: 5.24 x10E6/uL (ref 4.14–5.80)
RDW: 12.1 % (ref 11.6–15.4)
WBC: 6.6 10*3/uL (ref 3.4–10.8)

## 2020-03-12 LAB — LIPID PANEL
Chol/HDL Ratio: 4.4 ratio (ref 0.0–5.0)
Cholesterol, Total: 218 mg/dL — ABNORMAL HIGH (ref 100–199)
HDL: 49 mg/dL (ref >39)
LDL Chol Calc (NIH): 156 mg/dL — ABNORMAL HIGH (ref 0–99)
Triglycerides: 73 mg/dL (ref 0–149)
VLDL Cholesterol Cal: 13 mg/dL (ref 5–40)

## 2020-03-12 LAB — HIV ANTIBODY (ROUTINE TESTING W REFLEX): HIV Screen 4th Generation wRfx: NONREACTIVE

## 2020-03-12 LAB — HEPATITIS C ANTIBODY: Hep C Virus Ab: 0.1 s/co ratio (ref 0.0–0.9)

## 2020-03-14 ENCOUNTER — Encounter: Payer: Self-pay | Admitting: Family Medicine

## 2020-03-14 DIAGNOSIS — E785 Hyperlipidemia, unspecified: Secondary | ICD-10-CM | POA: Insufficient documentation

## 2020-03-14 NOTE — Assessment & Plan Note (Signed)
Hyperlipidemia:needs to lower fat intake  Lipid Panel  Lab Results  Component Value Date   CHOL 218 (H) 03/11/2020   HDL 49 03/11/2020   LDLCALC 156 (H) 03/11/2020   TRIG 73 03/11/2020   CHOLHDL 4.4 03/11/2020

## 2020-03-14 NOTE — Assessment & Plan Note (Signed)
Controlled, no change in medication  

## 2020-03-16 ENCOUNTER — Ambulatory Visit: Payer: BC Managed Care – PPO | Admitting: Family Medicine

## 2020-07-08 ENCOUNTER — Encounter: Payer: Self-pay | Admitting: Family Medicine

## 2020-07-08 ENCOUNTER — Ambulatory Visit (HOSPITAL_COMMUNITY)
Admission: RE | Admit: 2020-07-08 | Discharge: 2020-07-08 | Disposition: A | Discharge status: Home / Self Care | Payer: BC Managed Care – PPO | Source: Ambulatory Visit | Attending: Family Medicine | Admitting: Family Medicine

## 2020-07-08 ENCOUNTER — Other Ambulatory Visit: Payer: Self-pay

## 2020-07-08 ENCOUNTER — Ambulatory Visit: Payer: BC Managed Care – PPO | Admitting: Family Medicine

## 2020-07-08 VITALS — BP 135/90 | HR 88 | Resp 15 | Ht 66.0 in | Wt 191.1 lb

## 2020-07-08 DIAGNOSIS — J3081 Allergic rhinitis due to animal (cat) (dog) hair and dander: Secondary | ICD-10-CM

## 2020-07-08 DIAGNOSIS — M419 Scoliosis, unspecified: Secondary | ICD-10-CM | POA: Diagnosis not present

## 2020-07-08 DIAGNOSIS — M542 Cervicalgia: Secondary | ICD-10-CM

## 2020-07-08 DIAGNOSIS — M546 Pain in thoracic spine: Secondary | ICD-10-CM | POA: Insufficient documentation

## 2020-07-08 DIAGNOSIS — F320 Major depressive disorder, single episode, mild: Secondary | ICD-10-CM

## 2020-07-08 DIAGNOSIS — E785 Hyperlipidemia, unspecified: Secondary | ICD-10-CM

## 2020-07-08 MED ORDER — IBUPROFEN 400 MG PO TABS: ORAL_TABLET | ORAL | 0 refills | Status: DC

## 2020-07-08 MED ORDER — KETOROLAC TROMETHAMINE 60 MG/2ML IM SOLN
60.0000 mg | Freq: Once | INTRAMUSCULAR | Status: AC
  Administered 2020-07-08: 60 mg via INTRAMUSCULAR

## 2020-07-08 MED ORDER — FAMOTIDINE 40 MG PO TABS: 40.0000 mg | ORAL_TABLET | Freq: Every day | ORAL | 0 refills | Status: DC

## 2020-07-08 MED ORDER — MONTELUKAST SODIUM 10 MG PO TABS: 10.0000 mg | ORAL_TABLET | Freq: Every day | ORAL | 3 refills | Status: DC

## 2020-07-08 MED ORDER — PREDNISONE 5 MG (21) PO TBPK
5.0000 mg | ORAL_TABLET | ORAL | 0 refills | Status: DC
Start: 2020-07-08 — End: 2020-08-18

## 2020-07-08 MED ORDER — METHYLPREDNISOLONE ACETATE 80 MG/ML IJ SUSP
80.0000 mg | Freq: Once | INTRAMUSCULAR | Status: AC
  Administered 2020-07-08: 80 mg via INTRAMUSCULAR

## 2020-07-08 NOTE — Patient Instructions (Addendum)
F/u in 6 weeks re evaluate pain, call if you need me sooner  Toradol 60 mg IM and depo Medrol 80 mg IM in office today  Please take medications as prescribed ibuprofen, prednisone and pepcid  You are referred for physical therapy  Please get X rays today  New for allergies is montelukast one daily  Continjue your fluoxetine and vit D every day  Thanks for choosing [facility], we consider it a privelige to serve you.

## 2020-07-08 NOTE — Progress Notes (Signed)
   Jimmy Landry     MRN: [id number]      DOB: Sep 23, 2000   HPI Jimmy Landry is here with a 1 month h/o right shoulder pain going down arm and causing numbness and tingling in his fingers, no inciting trauma, first episode  ROS Denies recent fever or chills. C/o increased nasal and sinus congestion, denies sore throat or ear pain Denies chest congestion, productive cough or wheezing. Denies chest pains, palpitations and leg swelling Denies abdominal pain, nausea, vomiting,diarrhea or constipation.   Denies dysuria, frequency, hesitancy or incontinence. . Denies headaches, seizures,  Denies uncontrolled depression, anxiety or insomnia. Denies skin break down or rash.   PE  BP 135/90   Pulse 88   Resp 15   Ht 5\' 6"  (1.676 m)   Wt 191 lb 1.9 oz (86.7 kg)   SpO2 95%   BMI 30.85 kg/m   Patient alert and oriented and in no cardiopulmonary distress.  HEENT: No facial asymmetry, EOMI,     Neck decreased ROM .  Chest: Clear to auscultation bilaterally.  CVS: S1, S2 no murmurs, no S3.Regular rate.  ABD: Soft non tender.   Ext: No edema  MS: Adequate ROM spine, shoulders, hips and knees.  Skin: Intact, no ulcerations or rash noted.  Psych: Good eye contact, normal affect. Memory intact not anxious or depressed appearing.  CNS: CN 2-12 intact, power,  normal throughout.no focal deficits noted.   Assessment & Plan  Thoracic spine pain Uncontrolled.Toradol and depo medrol administered IM in the office , to be followed by a short course of oral prednisone and NSAIDS. Needs X ray , and also  Referred for PT  Neck pain X ray c spine Uncontrolled.Toradol and depo medrol administered IM in the office , to be followed by a short course of oral prednisone and NSAIDS. Refer for PT  Chronic allergic rhinitis due to animal hair and dander uncontrolled , start daily singulair  Depression, major, single episode, mild (HCC) Controlled, no change in medication   Hyperlipidemia  LDL goal <100 Hyperlipidemia:Low fat diet discussed and encouraged.   Lipid Panel  Lab Results  Component Value Date   CHOL 218 (H) 03/11/2020   HDL 49 03/11/2020   LDLCALC 156 (H) 03/11/2020   TRIG 73 03/11/2020   CHOLHDL 4.4 03/11/2020   Needs to lower fat in diet

## 2020-07-13 ENCOUNTER — Encounter: Payer: Self-pay | Admitting: Family Medicine

## 2020-07-13 NOTE — Assessment & Plan Note (Signed)
Hyperlipidemia:Low fat diet discussed and encouraged.   Lipid Panel  Lab Results  Component Value Date   CHOL 218 (H) 03/11/2020   HDL 49 03/11/2020   LDLCALC 156 (H) 03/11/2020   TRIG 73 03/11/2020   CHOLHDL 4.4 03/11/2020   Needs to lower fat in diet

## 2020-07-13 NOTE — Assessment & Plan Note (Signed)
Uncontrolled.Toradol and depo medrol administered IM in the office , to be followed by a short course of oral prednisone and NSAIDS. Needs X ray , and also  Referred for PT

## 2020-07-13 NOTE — Assessment & Plan Note (Signed)
X ray c spine Uncontrolled.Toradol and depo medrol administered IM in the office , to be followed by a short course of oral prednisone and NSAIDS. Refer for PT

## 2020-07-13 NOTE — Assessment & Plan Note (Signed)
uncontrolled , start daily singulair

## 2020-07-13 NOTE — Assessment & Plan Note (Signed)
Controlled, no change in medication  

## 2020-07-19 ENCOUNTER — Ambulatory Visit (HOSPITAL_COMMUNITY): Payer: BC Managed Care – PPO | Attending: Family Medicine | Admitting: Physical Therapy

## 2020-07-19 ENCOUNTER — Encounter (HOSPITAL_COMMUNITY): Payer: Self-pay | Admitting: Physical Therapy

## 2020-07-19 ENCOUNTER — Other Ambulatory Visit: Payer: Self-pay

## 2020-07-19 DIAGNOSIS — M546 Pain in thoracic spine: Secondary | ICD-10-CM | POA: Diagnosis not present

## 2020-07-19 DIAGNOSIS — G8929 Other chronic pain: Secondary | ICD-10-CM | POA: Diagnosis not present

## 2020-07-19 NOTE — Therapy (Signed)
[facility] [address] [place], [place], [address] Phone: [phone]   Fax:  [phone]  Physical Therapy Evaluation  Patient Details  Name: Jimmy Landry MRN: [id number] Date of Birth: 2001-02-04 Referring Provider (PT): [place] [facility]   Encounter Date: 07/19/2020   PT End of Session - 07/19/20 0912     Visit Number 1    Number of Visits 8    Date for PT Re-Evaluation 08/18/20    Authorization Type BCBS    Authorization - Visit Number 1    Authorization - Number of Visits 90    Progress Note Due on Visit 8    PT Start Time [phone]    PT Stop Time 0913    PT Time Calculation (min) 40 min    Activity Tolerance Patient tolerated treatment well    Behavior During Therapy [facility] for tasks assessed/performed             Past Medical History:  Diagnosis Date   Allergy    Anxiety    Asthma    Depression, major, single episode, mild (HCC) 12/21/2016   Insomnia 12/21/2016   Otic fungal infection 03/13/2019    Past Surgical History:  Procedure Laterality Date   ESOPHAGOGASTRODUODENOSCOPY     for penny removal    NO PAST SURGERIES     TYMPANOSTOMY TUBE PLACEMENT     WISDOM TOOTH EXTRACTION      There were no vitals filed for this visit.    Subjective Assessment - 07/19/20 0842     Subjective Jimmy Landry states that he has been having pain on the Rt side of his upper back for about 90 days.  He was given a steroid pack which has seemed to decrease his pain 80%.   He had and continues to wake up with his right hand numb from the MCP joints to his fingers stating all are involved.  Once he starts moving the numbness goes away in his hand but he continute to have the pain in his Rt shoulder blade.    Patient Stated Goals no pain, no numbness    Currently in Pain? Yes    Pain Score 2    worst the pain gets is a 5/10 ; best 2/10   Pain Location Back    Pain Orientation Right    Pain Descriptors / Indicators Sore    Pain Type  Chronic pain    Pain Onset More than a month ago    Pain Frequency Constant    Aggravating Factors  sleeping    Pain Relieving Factors movement    Effect of Pain on Daily Activities none just continue to do it .                [facility] PT Assessment - 07/19/20 0001       Assessment   Medical Diagnosis Thoracic Pain    Referring Provider (PT) Claris Che Simprson    Onset Date/Surgical Date 04/12/20    Hand Dominance Right    Next MD Visit not scheduled    Prior Therapy no      Precautions   Precautions None      Restrictions   Weight Bearing Restrictions No      Balance Screen   Has the patient fallen in the past 6 months No    Has the patient had a decrease in activity level because of a fear of falling?  No    Is the patient reluctant  to leave their home because of a fear of falling?  No      Prior Function   Level of Independence Independent    Vocation Full time employment    Vocation Requirements shoveling    Leisure play video g[employer]      Cognition   Overall Cognitive Status Within Functional Limits for tasks assessed      Observation/Other Assessments   Focus on Therapeutic Outcomes (FOTO)  67, was 33%      Posture/Postural Control   Posture/Postural Control No significant limitations      ROM / Strength   AROM / PROM / Strength AROM;Strength      AROM   AROM Assessment Site Shoulder;Cervical    Right/Left Shoulder --   Shoulder ROM WFL B   Cervical Flexion WFL reps no change    Cervical Extension WFL; reps no change    Cervical - Right Side Bend wfl    Cervical - Left Side Bend wfl    Cervical - Right Rotation wfl    Cervical - Left Rotation wfl      Strength   Strength Assessment Site Cervical;Shoulder    Right/Left Shoulder --   Functional   Cervical Extension 5/5    Cervical - Right Side Bend 4/5    Cervical - Left Side Bend 4/5      Palpation   Palpation comment mm spasm in Rt upper trap                        Objective  measurements completed on examination: See above findings.       [facility] Adult PT Treatment/Exercise - 07/19/20 0001       Exercises   Exercises Neck      Neck Exercises: Seated   Neck Retraction Limitations scapular retraction x 10    Shoulder Rolls Backwards;10 reps    Other Seated Exercise thoracic excursion s x 3                    PT Education - 07/19/20 0911     Education Details HEP    Person(s) Educated Patient    Methods Verbal cues;Handout;Explanation;Demonstration    Comprehension Verbalized understanding;Returned demonstration              PT Short Term Goals - 07/19/20 0918       PT SHORT TERM GOAL #1   Title Pt to be I in HEP to decrease his pain to no greater than a 2/10    Time 2    Period Weeks    Status New    Target Date 08/02/20      PT SHORT TERM GOAL #2   Title PT to no longer be waken up at night due to numbing in his right hand.    Time 2    Period Weeks    Status New               PT Long Term Goals - 07/19/20 0919       PT LONG TERM GOAL #1   Title Pt to be I in an advance exercise program to obliterate Rt trap mm spasm and allow pain to be no greater than a 1/10.    Time 4    Period Weeks    Status New    Target Date 08/16/20      PT LONG TERM GOAL #2   Title Pt to state that he  has had no numbness in his hand for the past week    Time 4    Period Weeks    Status New      PT LONG TERM GOAL #3   Title Strength in cervical and  Rt shoulder to be wnl to allow pt to complete his job duties without discomfort.    Time 4    Period Weeks                    Plan - 07/19/20 0912     Clinical Impression Statement Jimmy Landry is a 20 yo male who has been experiencing Rt shoulder blade pain and Rt hand numbness for the past 90 days.  He has been referred to skilled PT.  Evaluation demonstrates decreased strength, increased pain and increased mm spasm.  Jimmy Landry will benefit from skilled PT for manual to  decrease his spasm and pain and exercises to improve his cervical and scapular stability.    Examination-Activity Limitations Other;Reach Overhead    Examination-Participation Restrictions Occupation;Other    Stability/Clinical Decision Making Stable/Uncomplicated    Clinical Decision Making Low    Rehab Potential Good    PT Frequency 2x / week    PT Duration 4 weeks    PT Treatment/Interventions Therapeutic exercise;Patient/family education;Therapeutic activities;Dry needling;Manual techniques    PT Next Visit Plan begin thereaband postural exercises, scapular and cervical stabilization exercises and manual to decrease pain and improve movement .    PT Home Exercise Plan scapular retraction, backward shoulder circles and thoracic excursions.             Patient will benefit from skilled therapeutic intervention in order to improve the following deficits and impairments:  Pain, Decreased strength, Increased fascial restricitons, Increased muscle spasms  Visit Diagnosis: Chronic right-sided thoracic back pain     Problem List Patient Active Problem List   Diagnosis Date Noted   Thoracic spine pain 07/08/2020   Neck pain 07/08/2020   Hyperlipidemia LDL goal <100 03/14/2020   Vaccine counseling 09/10/2019   Educated about COVID-19 virus infection 09/10/2019   Depression, major, single episode, mild (HCC) 12/21/2016   Learning disability 12/21/2016   Mild persistent asthma, uncomplicated 12/21/2015   Chronic allergic rhinitis due to animal hair and dander 12/21/2015   Virgina Organ, PT CLT [phone]  07/19/2020, 9:23 AM  [facility] [address] [place], [place], [address] Phone: [phone]   Fax:  [phone]  Name: Jimmy Landry MRN: [id number] Date of Birth: January 16, 2001

## 2020-07-24 ENCOUNTER — Other Ambulatory Visit: Payer: Self-pay | Admitting: Family Medicine

## 2020-07-27 ENCOUNTER — Ambulatory Visit (HOSPITAL_COMMUNITY): Payer: BC Managed Care – PPO | Admitting: Physical Therapy

## 2020-07-27 ENCOUNTER — Other Ambulatory Visit: Payer: Self-pay

## 2020-07-27 DIAGNOSIS — M546 Pain in thoracic spine: Secondary | ICD-10-CM | POA: Diagnosis not present

## 2020-07-27 DIAGNOSIS — G8929 Other chronic pain: Secondary | ICD-10-CM

## 2020-07-27 NOTE — Therapy (Signed)
[facility] [address] [place], [place], [address] Phone: [phone]   Fax:  [phone]  Physical Therapy Treatment  Patient Details  Name: Jimmy Landry MRN: [id number] Date of Birth: 18-Feb-2000 Referring Provider (PT): [place] [facility]   Encounter Date: 07/27/2020   PT End of Session - 07/27/20 0900     Visit Number 2    Number of Visits 8    Date for PT Re-Evaluation 08/18/20    Authorization Type BCBS    Authorization - Visit Number 2    Authorization - Number of Visits 90    Progress Note Due on Visit 8    PT Start Time 0830    PT Stop Time 0904    PT Time Calculation (min) 34 min    Activity Tolerance Patient tolerated treatment well    Behavior During Therapy [facility] for tasks assessed/performed             Past Medical History:  Diagnosis Date   Allergy    Anxiety    Asthma    Depression, major, single episode, mild (HCC) 12/21/2016   Insomnia 12/21/2016   Otic fungal infection 03/13/2019    Past Surgical History:  Procedure Laterality Date   ESOPHAGOGASTRODUODENOSCOPY     for penny removal    NO PAST SURGERIES     TYMPANOSTOMY TUBE PLACEMENT     WISDOM TOOTH EXTRACTION      There were no vitals filed for this visit.   Subjective Assessment - 07/27/20 0837     Subjective pt states his pain is in his central lower back today and is less than last week at 3/10 (actually rated at 2/10 last week).    Currently in Pain? Yes    Pain Score 3     Pain Location Back    Pain Orientation Right    Pain Descriptors / Indicators Sore    Pain Type Chronic pain                               [facility] Adult PT Treatment/Exercise - 07/27/20 0001       Neck Exercises: Theraband   Scapula Retraction 10 reps;Red    Shoulder Extension 10 reps;Red    Rows 10 reps;Red      Neck Exercises: Standing   Wall Push Ups 10 reps    Upper Extremity Flexion with Stabilization 10 reps    Other Standing  Exercises UE facing wall stretch 10X      Neck Exercises: Seated   Neck Retraction Limitations scapular retraction x 10    Shoulder Rolls Backwards;10 reps    Other Seated Exercise thoracic excursion s x 3      Neck Exercises: Prone   Shoulder Extension 10 reps    Rows 10 reps    Upper Extremity Flexion with Stabilization 5 reps    Other Prone Exercise POE 2 minutes                    PT Education - 07/27/20 0905     Education Details reviewed goals, HEP and pOC moving forward.  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    Person(s) Educated Patient    Methods Explanation    Comprehension Verbalized understanding              PT Short  Term Goals - 07/27/20 0901       PT SHORT TERM GOAL #1   Title Pt to be I in HEP to decrease his pain to no greater than a 2/10    Time 2    Period Weeks    Status On-going    Target Date 08/02/20      PT SHORT TERM GOAL #2   Title PT to no longer be waken up at night due to numbing in his right hand.    Time 2    Period Weeks    Status On-going               PT Long Term Goals - 07/27/20 0902       PT LONG TERM GOAL #1   Title Pt to be I in an advance exercise program to obliterate Rt trap mm spasm and allow pain to be no greater than a 1/10.    Time 4    Period Weeks    Status On-going      PT LONG TERM GOAL #2   Title Pt to state that he has had no numbness in his hand for the past week    Time 4    Period Weeks    Status On-going      PT LONG TERM GOAL #3   Title Strength in cervical and  Rt shoulder to be wnl to allow pt to complete his job duties without discomfort.    Time 4    Period Weeks    Status On-going                   Plan - 07/27/20 0906     Clinical Impression Statement Reviewed goals and POC moving forward.  Completed HEP with cues for form/holds.  Progressed with postural strengthening both in prone and  standing using therabands.  Pt required cues again for form and hold times.  UE flexion in prone was most challenging cut without any complaints completing all added therex this session.  Discussed posturing and how seated position is the worst for back.  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    Examination-Activity Limitations Other;Reach Overhead    Examination-Participation Restrictions Occupation;Other    Stability/Clinical Decision Making Stable/Uncomplicated    Rehab Potential Good    PT Frequency 2x / week    PT Duration 4 weeks    PT Treatment/Interventions Therapeutic exercise;Patient/family education;Therapeutic activities;Dry needling;Manual techniques    PT Next Visit Plan Contiue with postural exercises, scapular and cervical stabilization exercises and manual to decrease pain and improve movement .    PT Home Exercise Plan scapular retraction, backward shoulder circles and thoracic excursions.             Patient will benefit from skilled therapeutic intervention in order to improve the following deficits and impairments:  Pain, Decreased strength, Increased fascial restricitons, Increased muscle spasms  Visit Diagnosis: Chronic right-sided thoracic back pain     Problem List Patient Active Problem List   Diagnosis Date Noted   Thoracic spine pain 07/08/2020   Neck pain 07/08/2020   Hyperlipidemia LDL goal <100 03/14/2020   Vaccine counseling 09/10/2019   Educated about COVID-19 virus infection 09/10/2019   Depression, major, single episode, mild (HCC) 12/21/2016   Learning disability 12/21/2016   Mild persistent asthma, uncomplicated 12/21/2015  Chronic allergic rhinitis due to animal hair and dander 12/21/2015   Lurena Nida, PTA/CLT [phone]  Lurena Nida 07/27/2020, 9:07 AM  [facility] [address] [place], [place], [address] Phone: [phone]   Fax:  [phone]  Name: DAVIE CLAUD MRN: [id number] Date of Birth: 02-20-2000

## 2020-08-04 ENCOUNTER — Ambulatory Visit (HOSPITAL_COMMUNITY): Payer: BC Managed Care – PPO | Admitting: Physical Therapy

## 2020-08-04 ENCOUNTER — Other Ambulatory Visit: Payer: Self-pay

## 2020-08-04 DIAGNOSIS — M546 Pain in thoracic spine: Secondary | ICD-10-CM | POA: Diagnosis not present

## 2020-08-04 DIAGNOSIS — G8929 Other chronic pain: Secondary | ICD-10-CM | POA: Diagnosis not present

## 2020-08-04 NOTE — Therapy (Signed)
[facility] [address] [place], [place], [address] Phone: [phone]   Fax:  [phone]  Physical Therapy Treatment  Patient Details  Name: Jimmy Landry MRN: [id number] Date of Birth: 2000-10-06 Referring Provider (PT): Monon Bing   Encounter Date: 08/04/2020   PT End of Session - 08/04/20 0912     Visit Number 3    Number of Visits 8    Date for PT Re-Evaluation 08/18/20    Authorization Type BCBS    Authorization - Visit Number 3    Authorization - Number of Visits 90    Progress Note Due on Visit 8    PT Start Time 0834    PT Stop Time 0912    PT Time Calculation (min) 38 min    Activity Tolerance Patient tolerated treatment well    Behavior During Therapy [facility] for tasks assessed/performed             Past Medical History:  Diagnosis Date   Allergy    Anxiety    Asthma    Depression, major, single episode, mild (HCC) 12/21/2016   Insomnia 12/21/2016   Otic fungal infection 03/13/2019    Past Surgical History:  Procedure Laterality Date   ESOPHAGOGASTRODUODENOSCOPY     for penny removal    NO PAST SURGERIES     TYMPANOSTOMY TUBE PLACEMENT     WISDOM TOOTH EXTRACTION      There were no vitals filed for this visit.   Subjective Assessment - 08/04/20 0845     Subjective pt states he's noticed if he sleeps on his Rt side this Rt shoulder pain goes away.  Currently 3/10 in his shoulder.  No neck/cervical pain.    Currently in Pain? Yes    Pain Score 3     Pain Location Shoulder    Pain Orientation Right    Pain Descriptors / Indicators Aching;Shooting;Sore    Pain Radiating Towards Rt fingertips tingling/numbness that occurs 1-2X while he's sleeping; never happens otherwise.                               [facility] Adult PT Treatment/Exercise - 08/04/20 0001       Neck Exercises: Theraband   Scapula Retraction Red;20 reps    Scapula Retraction Limitations 2 sets of 10 reps     Shoulder Extension Red;20 reps    Shoulder Extension Limitations 2 sets of 10 reps    Rows Red;20 reps    Rows Limitations 2 sets of 10 reps      Neck Exercises: Standing   Wall Push Ups 15 reps    Upper Extremity Flexion with Stabilization 15 reps      Neck Exercises: Seated   Neck Retraction Limitations scapular retraction x 10    W Back 15 reps    Shoulder Rolls Backwards;20 reps    Shoulder Rolls Limitations 2 sets of 10 reps    Shoulder Flexion 15 reps    Shoulder ABduction 15 reps      Neck Exercises: Sidelying   Other Sidelying Exercise Rt ER with 3# weight 2X10 reps      Neck Exercises: Prone   Shoulder Extension 20 reps    Shoulder Extension Limitations 2 sets of 10 reps    Rows 20 reps    Rows Limitations 2 sets of 10 reps    Upper Extremity Flexion with Stabilization 20 reps  UE Flexion with Stabilization Limitations 2 sets of 10 reps      Neck Exercises: Stretches   Corner Stretch 3 reps;30 seconds                    PT Education - 08/04/20 0908     Education Details answered work duty related questions and educated on contoured pillow; instructed with using towel roll inside pillow case    Person(s) Educated Patient    Methods Explanation    Comprehension Verbalized understanding              PT Short Term Goals - 07/27/20 0901       PT SHORT TERM GOAL #1   Title Pt to be I in HEP to decrease his pain to no greater than a 2/10    Time 2    Period Weeks    Status On-going    Target Date 08/02/20      PT SHORT TERM GOAL #2   Title PT to no longer be waken up at night due to numbing in his right hand.    Time 2    Period Weeks    Status On-going               PT Long Term Goals - 07/27/20 0902       PT LONG TERM GOAL #1   Title Pt to be I in an advance exercise program to obliterate Rt trap mm spasm and allow pain to be no greater than a 1/10.    Time 4    Period Weeks    Status On-going      PT LONG TERM GOAL #2    Title Pt to state that he has had no numbness in his hand for the past week    Time 4    Period Weeks    Status On-going      PT LONG TERM GOAL #3   Title Strength in cervical and  Rt shoulder to be wnl to allow pt to complete his job duties without discomfort.    Time 4    Period Weeks    Status On-going                   Plan - 08/04/20 0914     Clinical Impression Statement contiued with UE stab and strengthening.  Able to increase most exercises to 2 sets this session.  Overall good form with minimal cues, mostly for hold times and completing therex slowly.  Utilized rolled towel inside pillow case and had patient lie sidelying with noted comfort reported from pateint.  suggested he try this at home to see if incidents of numbness/tingling reduce or stop using this.  Added sidelying ER using 3# weight with visual weakness of mm.  Discussed importance of posture with keeping cervical alignment and developing smart work habbits.  Pt reported no change in pain or symptoms at end of session.    Examination-Activity Limitations Other;Reach Overhead    Examination-Participation Restrictions Occupation;Other    Stability/Clinical Decision Making Stable/Uncomplicated    Rehab Potential Good    PT Frequency 2x / week    PT Duration 4 weeks    PT Treatment/Interventions Therapeutic exercise;Patient/family education;Therapeutic activities;Dry needling;Manual techniques    PT Next Visit Plan Contiue with postural exercises, scapular and cervical stabilization exercises and manual to decrease pain and improve movement .    PT Home Exercise Plan scapular retraction, backward shoulder circles and thoracic  excursions.             Patient will benefit from skilled therapeutic intervention in order to improve the following deficits and impairments:  Pain, Decreased strength, Increased fascial restricitons, Increased muscle spasms  Visit Diagnosis: Chronic right-sided thoracic back  pain     Problem List Patient Active Problem List   Diagnosis Date Noted   Thoracic spine pain 07/08/2020   Neck pain 07/08/2020   Hyperlipidemia LDL goal <100 03/14/2020   Vaccine counseling 09/10/2019   Educated about COVID-19 virus infection 09/10/2019   Depression, major, single episode, mild (HCC) 12/21/2016   Learning disability 12/21/2016   Mild persistent asthma, uncomplicated 12/21/2015   Chronic allergic rhinitis due to animal hair and dander 12/21/2015   Lurena Nida, PTA/CLT [phone]  Lurena Nida 08/04/2020, 9:18 AM  [facility] [address] [place], [place], [address] Phone: [phone]   Fax:  [phone]  Name: Jimmy Landry MRN: [id number] Date of Birth: 2000/10/26

## 2020-08-05 ENCOUNTER — Encounter (HOSPITAL_COMMUNITY): Payer: Self-pay | Admitting: Physical Therapy

## 2020-08-05 ENCOUNTER — Ambulatory Visit (HOSPITAL_COMMUNITY): Payer: BC Managed Care – PPO | Admitting: Physical Therapy

## 2020-08-05 DIAGNOSIS — G8929 Other chronic pain: Secondary | ICD-10-CM | POA: Diagnosis not present

## 2020-08-05 DIAGNOSIS — M546 Pain in thoracic spine: Secondary | ICD-10-CM | POA: Diagnosis not present

## 2020-08-05 NOTE — Therapy (Signed)
[facility] [address] [place], [place], [address] Phone: [phone]   Fax:  [phone]  Physical Therapy Treatment  Patient Details  Name: Jimmy Landry MRN: [id number] Date of Birth: 2000-08-19 Referring Provider (PT): Oliver Bing   Encounter Date: 08/05/2020   PT End of Session - 08/05/20 0822     Visit Number 4    Number of Visits 8    Date for PT Re-Evaluation 08/18/20    Authorization Type BCBS    Authorization - Visit Number 4    Authorization - Number of Visits 90    Progress Note Due on Visit 8    PT Start Time 0818    PT Stop Time 0856    PT Time Calculation (min) 38 min    Activity Tolerance Patient tolerated treatment well    Behavior During Therapy [facility] for tasks assessed/performed             Past Medical History:  Diagnosis Date   Allergy    Anxiety    Asthma    Depression, major, single episode, mild (HCC) 12/21/2016   Insomnia 12/21/2016   Otic fungal infection 03/13/2019    Past Surgical History:  Procedure Laterality Date   ESOPHAGOGASTRODUODENOSCOPY     for penny removal    NO PAST SURGERIES     TYMPANOSTOMY TUBE PLACEMENT     WISDOM TOOTH EXTRACTION      There were no vitals filed for this visit.   Subjective Assessment - 08/05/20 0821     Subjective Patient says things are getting better. Exercises are going well.    Currently in Pain? No/denies                               [facility] Adult PT Treatment/Exercise - 08/05/20 0001       Neck Exercises: Standing   Wall Push Ups 20 reps    Other Standing Exercises band rows/ extension/ ER GTB 2 x 10 each      Neck Exercises: Seated   Neck Retraction Limitations scapular retraction x 10    Shoulder Rolls Backwards;20 reps      Neck Exercises: Sidelying   Other Sidelying Exercise thoracic rotation x 10      Neck Exercises: Prone   Shoulder Extension 10 reps   5"   Rows 10 reps   5"   Other Prone Exercise  prone Ys and Ws 10 x 5"      Neck Exercises: Stretches   Upper Trapezius Stretch Right;Left;2 reps;30 seconds    Corner Stretch 3 reps;30 seconds                      PT Short Term Goals - 07/27/20 0901       PT SHORT TERM GOAL #1   Title Pt to be I in HEP to decrease his pain to no greater than a 2/10    Time 2    Period Weeks    Status On-going    Target Date 08/02/20      PT SHORT TERM GOAL #2   Title PT to no longer be waken up at night due to numbing in his right hand.    Time 2    Period Weeks    Status On-going               PT Long Term Goals - 07/27/20 6962  PT LONG TERM GOAL #1   Title Pt to be I in an advance exercise program to obliterate Rt trap mm spasm and allow pain to be no greater than a 1/10.    Time 4    Period Weeks    Status On-going      PT LONG TERM GOAL #2   Title Pt to state that he has had no numbness in his hand for the past week    Time 4    Period Weeks    Status On-going      PT LONG TERM GOAL #3   Title Strength in cervical and  Rt shoulder to be wnl to allow pt to complete his job duties without discomfort.    Time 4    Period Weeks    Status On-going                   Plan - 08/05/20 0857     Clinical Impression Statement Patient tolerated session well. Progressed postural strength and added chin tucks and band ER at wall. Issued updated HEP handout and green theraband for home exercise. Patient cued on proper form and hand position with band resisted exercise. Patient will continue to benefit from skilled therapy services to reduce deficits and improve functional ability.    Examination-Activity Limitations Other;Reach Overhead    Examination-Participation Restrictions Occupation;Other    Stability/Clinical Decision Making Stable/Uncomplicated    Rehab Potential Good    PT Frequency 2x / week    PT Duration 4 weeks    PT Treatment/Interventions Therapeutic exercise;Patient/family  education;Therapeutic activities;Dry needling;Manual techniques    PT Next Visit Plan Contiue with postural exercises, scapular and cervical stabilization exercises and manual to decrease pain and improve movement .    PT Home Exercise Plan scapular retraction, backward shoulder circles and thoracic excursions. 6/30 chin tuck, band row, extension    Consulted and Agree with Plan of Care Patient             Patient will benefit from skilled therapeutic intervention in order to improve the following deficits and impairments:  Pain, Decreased strength, Increased fascial restricitons, Increased muscle spasms  Visit Diagnosis: Chronic right-sided thoracic back pain     Problem List Patient Active Problem List   Diagnosis Date Noted   Thoracic spine pain 07/08/2020   Neck pain 07/08/2020   Hyperlipidemia LDL goal <100 03/14/2020   Vaccine counseling 09/10/2019   Educated about COVID-19 virus infection 09/10/2019   Depression, major, single episode, mild (HCC) 12/21/2016   Learning disability 12/21/2016   Mild persistent asthma, uncomplicated 12/21/2015   Chronic allergic rhinitis due to animal hair and dander 12/21/2015   8:59 AM, 08/05/20 Georges Lynch PT DPT  Physical Therapist with [facility]  [phone]   [facility] [address] [place], [place], [address] Phone: [phone]   Fax:  [phone]  Name: Jimmy Landry MRN: [id number] Date of Birth: 2000-08-06

## 2020-08-05 NOTE — Patient Instructions (Signed)
Access Code: [email or web address] URL: [email or web address] Date: 08/05/2020 Prepared by: Georges Lynch  Exercises Seated Cervical Retraction - 2-3 x daily - 7 x weekly - 2 sets - 10 reps - 5 second hold Standing Shoulder Row with Anchored Resistance - 2-3 x daily - 7 x weekly - 2 sets - 10 reps Shoulder Extension with Resistance - 2-3 x daily - 7 x weekly - 2 sets - 10 reps Corner Pec Major Stretch - 2-3 x daily - 7 x weekly - 1 sets - 3 reps - 30 second hold

## 2020-08-11 ENCOUNTER — Ambulatory Visit (HOSPITAL_COMMUNITY): Payer: BC Managed Care – PPO | Attending: Family Medicine | Admitting: Physical Therapy

## 2020-08-11 ENCOUNTER — Other Ambulatory Visit: Payer: Self-pay

## 2020-08-11 DIAGNOSIS — G8929 Other chronic pain: Secondary | ICD-10-CM | POA: Diagnosis not present

## 2020-08-11 DIAGNOSIS — M546 Pain in thoracic spine: Secondary | ICD-10-CM | POA: Diagnosis not present

## 2020-08-11 NOTE — Therapy (Signed)
[facility] [address] [place], [place], [address] Phone: [phone]   Fax:  [phone]  Physical Therapy Treatment  Patient Details  Name: Jimmy Landry MRN: [id number] Date of Birth: 24-Apr-2000 Referring Provider (PT): Garrison Bing   Encounter Date: 08/11/2020   PT End of Session - 08/11/20 1020     Visit Number 5    Number of Visits 8    Date for PT Re-Evaluation 08/18/20    Authorization Type BCBS    Authorization - Visit Number 5    Authorization - Number of Visits 90    Progress Note Due on Visit 8    PT Start Time [phone]    PT Stop Time 0915    PT Time Calculation (min) 42 min    Activity Tolerance Patient tolerated treatment well    Behavior During Therapy [facility] for tasks assessed/performed             Past Medical History:  Diagnosis Date   Allergy    Anxiety    Asthma    Depression, major, single episode, mild (HCC) 12/21/2016   Insomnia 12/21/2016   Otic fungal infection 03/13/2019    Past Surgical History:  Procedure Laterality Date   ESOPHAGOGASTRODUODENOSCOPY     for penny removal    NO PAST SURGERIES     TYMPANOSTOMY TUBE PLACEMENT     WISDOM TOOTH EXTRACTION      There were no vitals filed for this visit.   Subjective Assessment - 08/11/20 1016     Subjective pt states he is much better, pain of 1/10 and no pain down UE in a long time.  Informed of re-eval next visit and feels he may be ready for discharge.    Currently in Pain? Yes    Pain Score 1     Pain Location Shoulder    Pain Orientation Right    Pain Descriptors / Indicators Aching                               [facility] Adult PT Treatment/Exercise - 08/11/20 0001       Neck Exercises: Machines for Strengthening   UBE (Upper Arm Bike) 4 min backward level 1 at BOS      Neck Exercises: Theraband   Scapula Retraction 20 reps;Green    Scapula Retraction Limitations 2 sets of 10 reps    Shoulder Extension  Green;20 reps    Shoulder Extension Limitations 2 sets of 10 reps    Rows Green;20 reps    Rows Limitations 2 sets of 10 reps      Neck Exercises: Standing   Wall Push Ups 20 reps      Neck Exercises: Seated   Other Seated Exercise thoracic excursion s x 3      Neck Exercises: Sidelying   Other Sidelying Exercise Rt ER with 3# weight 2X10 reps    Other Sidelying Exercise thoracic rotation x 10      Neck Exercises: Prone   Shoulder Extension 20 reps    Shoulder Extension Limitations 2 sets of 10 reps    Rows 20 reps    Rows Limitations 2 sets of 10 reps    Upper Extremity Flexion with Stabilization 20 reps    UE Flexion with Stabilization Limitations 2 sets of 10 reps    Other Prone Exercise prone Ys and Ws 10 x 5"  Neck Exercises: Stretches   Upper Trapezius Stretch Right;Left;2 reps;30 seconds    Corner Stretch 3 reps;30 seconds                      PT Short Term Goals - 07/27/20 0901       PT SHORT TERM GOAL #1   Title Pt to be I in HEP to decrease his pain to no greater than a 2/10    Time 2    Period Weeks    Status On-going    Target Date 08/02/20      PT SHORT TERM GOAL #2   Title PT to no longer be waken up at night due to numbing in his right hand.    Time 2    Period Weeks    Status On-going               PT Long Term Goals - 07/27/20 0902       PT LONG TERM GOAL #1   Title Pt to be I in an advance exercise program to obliterate Rt trap mm spasm and allow pain to be no greater than a 1/10.    Time 4    Period Weeks    Status On-going      PT LONG TERM GOAL #2   Title Pt to state that he has had no numbness in his hand for the past week    Time 4    Period Weeks    Status On-going      PT LONG TERM GOAL #3   Title Strength in cervical and  Rt shoulder to be wnl to allow pt to complete his job duties without discomfort.    Time 4    Period Weeks    Status On-going                   Plan - 08/11/20 1020      Clinical Impression Statement Resumed thoracic excursions this session as with noted weakness and poor posture displayed, however pt is much better overall and requiring less cues with all therex.  Pt tends to complete tband too quickly requiring cues to control movements and hold resistance.  discussed current progress with therapy and aware of upcoming re-eval.  Pt with no current issues and overall pleased with progress.    Examination-Activity Limitations Other;Reach Overhead    Examination-Participation Restrictions Occupation;Other    Stability/Clinical Decision Making Stable/Uncomplicated    Rehab Potential Good    PT Frequency 2x / week    PT Duration 4 weeks    PT Treatment/Interventions Therapeutic exercise;Patient/family education;Therapeutic activities;Dry needling;Manual techniques    PT Next Visit Plan Reassess next visit with possible discharge.    PT Home Exercise Plan scapular retraction, backward shoulder circles and thoracic excursions. 6/30 chin tuck, band row, extension    Consulted and Agree with Plan of Care Patient             Patient will benefit from skilled therapeutic intervention in order to improve the following deficits and impairments:  Pain, Decreased strength, Increased fascial restricitons, Increased muscle spasms  Visit Diagnosis: Chronic right-sided thoracic back pain     Problem List Patient Active Problem List   Diagnosis Date Noted   Thoracic spine pain 07/08/2020   Neck pain 07/08/2020   Hyperlipidemia LDL goal <100 03/14/2020   Vaccine counseling 09/10/2019   Educated about COVID-19 virus infection 09/10/2019   Depression, major, single episode, mild (HCC)  12/21/2016   Learning disability 12/21/2016   Mild persistent asthma, uncomplicated 12/21/2015   Chronic allergic rhinitis due to animal hair and dander 12/21/2015   Lurena Nida, PTA/CLT [phone]  Lurena Nida 08/11/2020, 10:28 AM   [facility] [address] [place], [place], [address] Phone: [phone]   Fax:  [phone]  Name: Jimmy Landry MRN: [id number] Date of Birth: [birth year]

## 2020-08-13 ENCOUNTER — Other Ambulatory Visit: Payer: Self-pay

## 2020-08-13 ENCOUNTER — Telehealth (HOSPITAL_COMMUNITY): Payer: Self-pay | Admitting: Physical Therapy

## 2020-08-13 ENCOUNTER — Encounter (HOSPITAL_COMMUNITY): Payer: Self-pay | Admitting: Physical Therapy

## 2020-08-13 ENCOUNTER — Ambulatory Visit (HOSPITAL_COMMUNITY): Payer: BC Managed Care – PPO | Admitting: Physical Therapy

## 2020-08-13 DIAGNOSIS — G8929 Other chronic pain: Secondary | ICD-10-CM

## 2020-08-13 DIAGNOSIS — M546 Pain in thoracic spine: Secondary | ICD-10-CM | POA: Diagnosis not present

## 2020-08-13 NOTE — Therapy (Signed)
[facility] [address] [place], [place], [address] Phone: [phone]   Fax:  [phone]  Physical Therapy Treatment  Patient Details  Name: Jimmy Landry MRN: [id number] Date of Birth: 23-Oct-2000 Referring Provider (PT): Talmadge Chad  PHYSICAL THERAPY DISCHARGE SUMMARY  Visits from Start of Care: 6  Current functional level related to goals / functional outcomes: PT has no pain or numbness    Remaining deficits: none   Education / Equipment: HEP   Patient agrees to discharge. Patient goals were met. Patient is being discharged due to meeting the stated rehab goals.  Encounter Date: 08/13/2020   PT End of Session - 08/13/20 0848     Visit Number 6    Number of Visits 6    Date for PT Re-Evaluation 08/18/20    Authorization Type BC[employer]    Authorization - Visit Number 6    Authorization - Number of Visits 79    PT Start Time 0825    PT Stop Time 0845    PT Time Calculation (min) 20 min    Activity Tolerance Patient tolerated treatment well    Behavior During Therapy WFL for tasks assessed/performed             Past Medical History:  Diagnosis Date   Allergy    Anxiety    Asthma    Depression, major, single episode, mild ([place]) 12/21/2016   Insomnia 12/21/2016   Otic fungal infection 03/13/2019    Past Surgical History:  Procedure Laterality Date   ESOPHAGOGASTRODUODENOSCOPY     for penny removal    NO PAST SURGERIES     TYMPANOSTOMY TUBE PLACEMENT     WISDOM TOOTH EXTRACTION      There were no vitals filed for this visit.   Subjective Assessment - 08/13/20 0823     Subjective Pt states that he feels that he is 80% better.  HE is doing better sleeping    Patient Stated Goals no pain, no numbness    Currently in Pain? No/denies    Pain Onset More than a month ago                [facility] PT Assessment - 08/13/20 0001       Assessment   Medical Diagnosis Thoracic Pain    Referring Provider (PT)  Joycelyn Schmid Simprson    Onset Date/Surgical Date 04/12/20    Hand Dominance Right    Next MD Visit not scheduled    Prior Therapy no      Precautions   Precautions None      Restrictions   Weight Bearing Restrictions No      Prior Function   Level of Independence Independent    Vocation Full time employment    Vocation Requirements shoveling    Leisure play video garmes      Cognition   Overall Cognitive Status Within Functional Limits for tasks assessed      Observation/Other Assessments   Focus on Therapeutic Outcomes (FOTO)  82 was 33%      Posture/Postural Control   Posture/Postural Control No significant limitations      AROM   Cervical Flexion WFL reps no change    Cervical Extension WFL; reps no change    Cervical - Right Side Bend wfl    Cervical - Left Side Bend wfl    Cervical - Right Rotation wfl    Cervical - Left Rotation wfl      Strength  Cervical Extension 5/5    Cervical - Right Side Bend 5/5   was 4/5   Cervical - Left Side Bend 5/5   was 4/5     Palpation   Palpation comment no spasms                                   PT Education - 08/13/20 0847     Education Details reviewed questions on HEP, explained that upper trap is a common area to have tension and continued stretching will help with this.    Person(s) Educated Patient    Methods Explanation    Comprehension Verbalized understanding              PT Short Term Goals - 08/13/20 0841       PT SHORT TERM GOAL #1   Title Pt to be I in HEP to decrease his pain to no greater than a 2/10    Time 2    Period Weeks    Status Achieved      PT SHORT TERM GOAL #2   Title PT to no longer be waken up at night due to numbing in his right hand.    Time 2    Period Weeks    Status Achieved               PT Long Term Goals - 08/13/20 3009       PT LONG TERM GOAL #1   Title Pt to be I in an advance exercise program to obliterate Rt trap mm spasm and  allow pain to be no greater than a 1/10.    Time 4    Period Weeks    Status Achieved      PT LONG TERM GOAL #2   Title Pt to state that he has had no numbness in his hand for the past week    Time 4    Period Weeks    Status Achieved      PT LONG TERM GOAL #3   Title Strength in cervical and  Rt shoulder to be wnl to allow pt to complete his job duties without discomfort.    Time 4    Period Weeks    Status Achieved                   Plan - 08/13/20 0849     Clinical Impression Statement PT comes to department with no pain, no numbness and states that he is sleeping witnout waking due to numbness in hand.  Therapist encouraged pt to continue stretches to prevent tightening of upper trap and sx returning.  PT verbalized understanding.    Examination-Activity Limitations Other;Reach Overhead    Examination-Participation Restrictions Occupation;Other    Stability/Clinical Decision Making Stable/Uncomplicated    Rehab Potential Good    PT Frequency 2x / week    PT Duration 4 weeks    PT Treatment/Interventions Therapeutic exercise;Patient/family education;Therapeutic activities;Dry needling;Manual techniques    PT Next Visit Plan Discharge.    PT Home Exercise Plan scapular retraction, backward shoulder circles and thoracic excursions. 6/30 chin tuck, band row, extension    Consulted and Agree with Plan of Care Patient             Patient will benefit from skilled therapeutic intervention in order to improve the following deficits and impairments:  Pain, Decreased strength, Increased fascial restricitons, Increased muscle  spasms  Visit Diagnosis: Chronic right-sided thoracic back pain     Problem List Patient Active Problem List   Diagnosis Date Noted   Thoracic spine pain 07/08/2020   Neck pain 07/08/2020   Hyperlipidemia LDL goal <100 03/14/2020   Vaccine counseling 09/10/2019   Educated about COVID-19 virus infection 09/10/2019   Depression, major,  single episode, mild ([place]) 12/21/2016   Learning disability 12/21/2016   Mild persistent asthma, uncomplicated 75/17/0017   Chronic allergic rhinitis due to animal hair and dander 12/21/2015   Rayetta Humphrey, PT CLT [phone]  08/13/2020, 8:52 AM  [facility] [place], [place], [address] Phone: [phone]   Fax:  [phone]  Name: Jimmy Landry MRN: [id number] Date of Birth: 28-Aug-2000

## 2020-08-13 NOTE — Telephone Encounter (Signed)
D/c today and cx all future appointments

## 2020-08-17 ENCOUNTER — Encounter (HOSPITAL_COMMUNITY): Payer: BC Managed Care – PPO | Admitting: Physical Therapy

## 2020-08-18 ENCOUNTER — Encounter: Payer: Self-pay | Admitting: Family Medicine

## 2020-08-18 ENCOUNTER — Other Ambulatory Visit: Payer: Self-pay

## 2020-08-18 ENCOUNTER — Ambulatory Visit: Payer: BC Managed Care – PPO | Admitting: Family Medicine

## 2020-08-18 VITALS — BP 105/76 | HR 92 | Temp 99.1°F | Resp 20 | Ht 66.0 in | Wt 197.0 lb

## 2020-08-18 DIAGNOSIS — E785 Hyperlipidemia, unspecified: Secondary | ICD-10-CM

## 2020-08-18 DIAGNOSIS — J4530 Mild persistent asthma, uncomplicated: Secondary | ICD-10-CM | POA: Diagnosis not present

## 2020-08-18 DIAGNOSIS — F320 Major depressive disorder, single episode, mild: Secondary | ICD-10-CM

## 2020-08-18 DIAGNOSIS — Z23 Encounter for immunization: Secondary | ICD-10-CM | POA: Diagnosis not present

## 2020-08-18 MED ORDER — FLUOXETINE HCL 20 MG PO CAPS: ORAL_CAPSULE | ORAL | 5 refills | Status: DC

## 2020-08-18 MED ORDER — LORATADINE 10 MG PO TABS: 10.0000 mg | ORAL_TABLET | Freq: Every day | ORAL | 11 refills | Status: AC

## 2020-08-18 NOTE — Assessment & Plan Note (Signed)
Hyperlipidemia:Low fat diet discussed and encouraged.   Lipid Panel  Lab Results  Component Value Date   CHOL 218 (H) 03/11/2020   HDL 49 03/11/2020   LDLCALC 156 (H) 03/11/2020   TRIG 73 03/11/2020   CHOLHDL 4.4 03/11/2020

## 2020-08-18 NOTE — Assessment & Plan Note (Signed)
Controlled, no change in medication  

## 2020-08-18 NOTE — Progress Notes (Signed)
   Jimmy Landry     MRN: [id number]      DOB: Aug 22, 2000   HPI Jimmy Landry is here for follow up and re-evaluation of chronic medical conditions, medication management and review of any available recent lab and radiology data.  Preventive health is updated, specifically  Cancer screening and Immunization.   Reports good results with medication and physical therapy for neck and shoulder pain.  Complains of uncontrolled allergy symptoms with itchy watery eyes on Singulair once daily for back to Claritin. ROS Denies recent fever or chills. Denies sinus pressure, nasal congestion, ear pain or sore throat. Denies chest congestion, productive cough or wheezing. Denies chest pains, palpitations and leg swelling Denies abdominal pain, nausea, vomiting,diarrhea or constipation.   Denies dysuria, frequency, hesitancy or incontinence. Denies joint pain, swelling and limitation in mobility. Denies headaches, seizures, numbness, or tingling. Denies depression, anxiety or insomnia. Denies skin break down or rash.   PE  BP 105/76 (BP Location: Right Arm, Patient Position: Sitting, Cuff Size: Large)   Pulse 92   Temp 99.1 F (37.3 C)   Resp 20   Ht 5\' 6"  (1.676 m)   Wt 197 lb (89.4 kg)   SpO2 95%   BMI 31.80 kg/m   Patient alert and oriented and in no cardiopulmonary distress.  HEENT: No facial asymmetry, EOMI,     Neck supple .  Chest: Clear to auscultation bilaterally.  CVS: S1, S2 no murmurs, no S3.Regular rate.  ABD: Soft non tender.   Ext: No edema  MS: Adequate ROM spine, shoulders, hips and knees.  Skin: Intact, no ulcerations or rash noted.  Psych: Good eye contact, normal affect. Memory intact not anxious or depressed appearing.  CNS: CN 2-12 intact, power,  normal throughout.no focal deficits noted.   Assessment & Plan  Depression, major, single episode, mild ([facility]) Controlled, no change in medication   Hyperlipidemia LDL goal <100 Hyperlipidemia:Low fat diet  discussed and encouraged.   Lipid Panel  Lab Results  Component Value Date   CHOL 218 (H) 03/11/2020   HDL 49 03/11/2020   LDLCALC 156 (H) 03/11/2020   TRIG 73 03/11/2020   CHOLHDL 4.4 03/11/2020       Mild persistent asthma, uncomplicated Stable and controlled with minimal need for inhaler. Pneumonia 23 given at visit

## 2020-08-18 NOTE — Patient Instructions (Signed)
Annual exam in December, call if you need me sooner.  Pneumonia 23 vaccine today.  Allergy medicine is changed to Claritin once daily.  Singulair as this is not working well for you.  Continue to follow a low-fat diet eating mainly vegetables and fruits to lower your cholesterol.  It is important that you exercise regularly at least 30 minutes 5 times a week. If you develop chest pain, have severe difficulty breathing, or feel very tired, stop exercising immediately and seek medical attention  Thanks for choosing [facility], we consider it a privelige to serve you.

## 2020-08-18 NOTE — Assessment & Plan Note (Signed)
Stable and controlled with minimal need for inhaler. Pneumonia 23 given at visit

## 2020-08-19 ENCOUNTER — Encounter (HOSPITAL_COMMUNITY): Payer: BC Managed Care – PPO | Admitting: Physical Therapy

## 2020-08-24 ENCOUNTER — Encounter (HOSPITAL_COMMUNITY): Payer: BC Managed Care – PPO | Admitting: Physical Therapy

## 2020-08-26 ENCOUNTER — Encounter (HOSPITAL_COMMUNITY): Payer: BC Managed Care – PPO | Admitting: Physical Therapy

## 2020-08-31 ENCOUNTER — Encounter (HOSPITAL_COMMUNITY): Payer: BC Managed Care – PPO | Admitting: Physical Therapy

## 2020-09-02 ENCOUNTER — Encounter (HOSPITAL_COMMUNITY): Payer: BC Managed Care – PPO | Admitting: Physical Therapy

## 2021-01-18 ENCOUNTER — Other Ambulatory Visit: Payer: Self-pay

## 2021-01-18 ENCOUNTER — Encounter: Payer: Self-pay | Admitting: Nurse Practitioner

## 2021-01-18 ENCOUNTER — Ambulatory Visit (INDEPENDENT_AMBULATORY_CARE_PROVIDER_SITE_OTHER): Payer: BC Managed Care – PPO | Admitting: Nurse Practitioner

## 2021-01-18 VITALS — BP 144/84 | HR 98 | Ht 66.0 in | Wt 208.1 lb

## 2021-01-18 DIAGNOSIS — H6122 Impacted cerumen, left ear: Secondary | ICD-10-CM | POA: Diagnosis not present

## 2021-01-18 DIAGNOSIS — E785 Hyperlipidemia, unspecified: Secondary | ICD-10-CM

## 2021-01-18 DIAGNOSIS — Z0001 Encounter for general adult medical examination with abnormal findings: Secondary | ICD-10-CM

## 2021-01-18 DIAGNOSIS — Z7185 Encounter for immunization safety counseling: Secondary | ICD-10-CM | POA: Diagnosis not present

## 2021-01-18 NOTE — Assessment & Plan Note (Signed)
Lab Results  Component Value Date   CHOL 218 (H) 03/11/2020   HDL 49 03/11/2020   LDLCALC 156 (H) 03/11/2020   TRIG 73 03/11/2020   CHOLHDL 4.4 03/11/2020   -cut back on fried/fatty foods

## 2021-01-18 NOTE — Assessment & Plan Note (Addendum)
-  pt wears earplugs at work and has had impaction previously -irrigated today -after irrigation, he has some scarring to his left TM, and he had tympanostomy tubes when he was younger -no pain or hearing issues today

## 2021-01-18 NOTE — Patient Instructions (Signed)
Please have fasting labs drawn as soon as possible.  I will be moving to [facility] located at [address], [place], [place] [address] effective Feb 06, 2021. If you would like to establish care with [facility] please call [phone]. 

## 2021-01-18 NOTE — Assessment & Plan Note (Signed)
-   flu shot today

## 2021-01-18 NOTE — Progress Notes (Signed)
Acute Office Visit  Subjective:    Patient ID: Jimmy Landry, male    DOB: [birth year], 20 y.o.   MRN: [id number]  Chief Complaint  Patient presents with   Annual Exam    cpe    HPI Patient is in today for physical exam. No acute concerns. He would like a flu shot today.  Past Medical History:  Diagnosis Date   Allergy    Anxiety    Asthma    Depression, major, single episode, mild ([place]) 12/21/2016   Insomnia 12/21/2016   Otic fungal infection 03/13/2019    Past Surgical History:  Procedure Laterality Date   ESOPHAGOGASTRODUODENOSCOPY     for penny removal    NO PAST SURGERIES     TYMPANOSTOMY TUBE PLACEMENT     WISDOM TOOTH EXTRACTION      Family History  Problem Relation Age of Onset   Allergic rhinitis Mother    Asthma Mother    Heart disease Mother    Depression Mother    Diabetes Mother    Hypertension Mother    Hyperlipidemia Mother    Miscarriages / [place] Mother    Cancer Father        lung   Learning disabilities Father    COPD Father    Heart disease Sister 6       birth defect heart   Heart disease Maternal Grandmother    Cancer Maternal Grandfather        lung cancer to brain   Heart disease Paternal Grandmother    Angioedema Neg Hx    Atopy Neg Hx    Eczema Neg Hx    Immunodeficiency Neg Hx    Urticaria Neg Hx     Social History   Socioeconomic History   Marital status: Single    Spouse name: Not on file   Number of children: Not on file   Years of education: Not on file   Highest education level: High school graduate  Occupational History   Occupation: student  Tobacco Use   Smoking status: Never   Smokeless tobacco: Never  Vaping Use   Vaping Use: Never used  Substance and Sexual Activity   Alcohol use: No   Drug use: No   Sexual activity: Not Currently  Other Topics Concern   Not on file  Social History Narrative   Lives mom and older brother Nicole [facility]   Peanut-weenie/chi   Raisin-weenie/chi      Enjoy: watching  tv, video games, music      Diet: veggies, fruits, meat-chicken, some red meat. Not big on snacks   Caffeine: 5 hour energy drinks for work (one bottle for 3 days), coke, sprite   Water: 8 cups day       Wears seat belt    Wears sun protection   Smoke detectors at home   Does not use phone while driving   Social Determinants of Radio broadcast assistant Strain: Not on file  Food Insecurity: Not on file  Transportation Needs: Not on file  Physical Activity: Not on file  Stress: Not on file  Social Connections: Not on file  Intimate Partner Violence: Not on file    Outpatient Medications Prior to Visit  Medication Sig Dispense Refill   albuterol (VENTOLIN HFA) 108 (90 Base) MCG/ACT inhaler Inhale 2 puffs into the lungs every 4 (four) hours as needed for wheezing or shortness of breath. 18 g 3   FLUoxetine (PROZAC) 20 MG capsule TAKE 3  CAPSULES BY MOUTH ONCE DAILY. 90 capsule 0   loratadine (CLARITIN) 10 MG tablet Take 1 tablet (10 mg total) by mouth daily. 30 tablet 11   VITAMIN D PO Take 10,000 Units by mouth daily.     FLUoxetine (PROZAC) 20 MG capsule Take 3 capsules by mouth once daily. 90 capsule 5   Facility-Administered Medications Prior to Visit  Medication Dose Route Frequency Provider Last Rate Last Admin   polyethylene glycol powder (GLYCOLAX/MIRALAX) container 255 g  1 Container Oral Once Perlie Mayo, NP        No Known Allergies  Review of Systems  Constitutional: Negative.   HENT: Negative.    Eyes: Negative.   Respiratory: Negative.    Cardiovascular: Negative.   Gastrointestinal: Negative.   Endocrine: Negative.   Genitourinary: Negative.   Musculoskeletal: Negative.   Skin: Negative.   Allergic/Immunologic: Negative.   Neurological: Negative.   Hematological: Negative.   Psychiatric/Behavioral: Negative.        Objective:    Physical Exam Constitutional:      Appearance: Normal appearance.  HENT:     Head: Normocephalic and atraumatic.      Right Ear: Tympanic membrane, ear canal and external ear normal.     Left Ear: Ear canal and external ear normal. There is impacted cerumen.     Nose: Nose normal.     Mouth/Throat:     Mouth: Mucous membranes are moist.     Pharynx: Oropharynx is clear.  Eyes:     Extraocular Movements: Extraocular movements intact.     Conjunctiva/sclera: Conjunctivae normal.     Pupils: Pupils are equal, round, and reactive to light.  Cardiovascular:     Rate and Rhythm: Normal rate and regular rhythm.     Pulses: Normal pulses.     Heart sounds: Normal heart sounds.  Pulmonary:     Effort: Pulmonary effort is normal.     Breath sounds: Normal breath sounds.  Abdominal:     General: Abdomen is flat. Bowel sounds are normal.     Palpations: Abdomen is soft.  Musculoskeletal:        General: Normal range of motion.     Cervical back: Normal range of motion and neck supple.  Skin:    General: Skin is warm and dry.     Capillary Refill: Capillary refill takes less than 2 seconds.  Neurological:     General: No focal deficit present.     Mental Status: He is alert and oriented to person, place, and time.     Cranial Nerves: No cranial nerve deficit.     Sensory: No sensory deficit.     Motor: No weakness.     Coordination: Coordination normal.     Gait: Gait normal.  Psychiatric:        Mood and Affect: Mood normal.        Behavior: Behavior normal.        Thought Content: Thought content normal.        Judgment: Judgment normal.    BP (!) 144/84   Pulse 98   Ht _0  (1.676 m)   Wt 208 lb 1.9 oz (94.4 kg)   SpO2 96%   BMI 33.59 kg/m  Wt Readings from Last 3 Encounters:  01/18/21 208 lb 1.9 oz (94.4 kg)  08/18/20 197 lb (89.4 kg)  07/08/20 191 lb 1.9 oz (86.7 kg)    Health Maintenance Due  Topic Date Due   COVID-19 Vaccine (3 -  Mixed Product risk series) 11/12/2019   INFLUENZA VACCINE  09/06/2020    There are no preventive care reminders to display for this  patient.   No results found for: TSH Lab Results  Component Value Date   WBC 6.6 03/11/2020   HGB 14.9 03/11/2020   HCT 44.2 03/11/2020   MCV 84 03/11/2020   Lab Results  Component Value Date   NA 137 03/11/2020   K 3.9 03/11/2020   CO2 22 03/11/2020   GLUCOSE 97 03/11/2020   BUN 12 03/11/2020   CREATININE 0.75 (L) 03/11/2020   BILITOT 0.3 03/11/2020   ALKPHOS 89 03/11/2020   AST 20 03/11/2020   ALT 20 03/11/2020   PROT 7.1 03/11/2020   ALBUMIN 4.6 03/11/2020   CALCIUM 9.7 03/11/2020   Lab Results  Component Value Date   CHOL 218 (H) 03/11/2020   Lab Results  Component Value Date   HDL 49 03/11/2020   Lab Results  Component Value Date   LDLCALC 156 (H) 03/11/2020   Lab Results  Component Value Date   TRIG 73 03/11/2020   Lab Results  Component Value Date   CHOLHDL 4.4 03/11/2020   No results found for: HGBA1C     Assessment & Plan:   Problem List Items Addressed This Visit       Nervous and Auditory   Impacted cerumen of left ear    -pt wears earplugs at work and has had impaction previously -irrigated today -after irrigation, he has some scarring to his left TM, and he had tympanostomy tubes when he was younger -no pain or hearing issues today        Other   Vaccine counseling    -flu shot today      Hyperlipidemia LDL goal <100    Lab Results  Component Value Date   CHOL 218 (H) 03/11/2020   HDL 49 03/11/2020   LDLCALC 156 (H) 03/11/2020   TRIG 73 03/11/2020   CHOLHDL 4.4 03/11/2020  -cut back on fried/fatty foods      Other Visit Diagnoses     Encounter for general adult medical examination with abnormal findings    -  Primary   Relevant Orders   CBC with Differential/Platelet   CMP14+EGFR   Lipid Panel With LDL/HDL Ratio        No orders of the defined types were placed in this encounter.    Noreene Larsson, NP

## 2021-03-03 ENCOUNTER — Other Ambulatory Visit: Payer: Self-pay | Admitting: Family Medicine

## 2021-07-19 ENCOUNTER — Ambulatory Visit: Payer: BC Managed Care – PPO | Admitting: Nurse Practitioner

## 2021-07-22 ENCOUNTER — Encounter: Payer: Self-pay | Admitting: Nurse Practitioner

## 2021-07-22 ENCOUNTER — Ambulatory Visit: Payer: BC Managed Care – PPO | Admitting: Nurse Practitioner

## 2021-07-22 VITALS — BP 120/75 | HR 95 | Ht 68.0 in | Wt 201.0 lb

## 2021-07-22 DIAGNOSIS — E669 Obesity, unspecified: Secondary | ICD-10-CM | POA: Insufficient documentation

## 2021-07-22 DIAGNOSIS — Z139 Encounter for screening, unspecified: Secondary | ICD-10-CM

## 2021-07-22 DIAGNOSIS — J3081 Allergic rhinitis due to animal (cat) (dog) hair and dander: Secondary | ICD-10-CM | POA: Diagnosis not present

## 2021-07-22 DIAGNOSIS — J4530 Mild persistent asthma, uncomplicated: Secondary | ICD-10-CM | POA: Diagnosis not present

## 2021-07-22 DIAGNOSIS — F320 Major depressive disorder, single episode, mild: Secondary | ICD-10-CM

## 2021-07-22 NOTE — Assessment & Plan Note (Signed)
Chronic condition well-controlled PHQ-9 score 0 Continue Prozac 60 mg daily Denies SI, HI

## 2021-07-22 NOTE — Assessment & Plan Note (Signed)
Wt Readings from Last 3 Encounters:  07/22/21 201 lb (91.2 kg)  01/18/21 208 lb 1.9 oz (94.4 kg)  08/18/20 197 lb (89.4 kg)  Patient stated that he does a lot of walking on the job but does not exercise regularly. Need to increase intake of whole food consisting mainly vegetables and protein less carbohydrate drinking at least 64 ounces of water daily, engaging in regular vigorous exercises at least 150 minutes weekly, importance of pressure control also discussed with patient he verbalized understanding

## 2021-07-22 NOTE — Assessment & Plan Note (Signed)
Chronic uncontrolled condition Has used singular and Flonase in the past Currently on Claritin 10 mg daily Patient referred to allergist

## 2021-07-22 NOTE — Progress Notes (Signed)
   Jimmy Landry     MRN: [id number]      DOB: 05/27/2000   HPI Jimmy Landry with past medical history of depression, mild persistent asthma, chronic allergic rhinitis, hyperlipidemia is here for follow up and re-evaluation of chronic medical conditions,    Pt stated that his seasonal allergies have been getting worse lately since the past 5 weeks , has been sneezing , has stuffy nose, congestion , takes claritin daily, he has used flonase nasal spray but it made him more stuffy, he used to get allergy injection in the past.  Patient denies fever, chills, cough, wheezing.  Would like referral to an allergist today      ROS Denies recent fever or chills. Denies sinus pressure, ear pain or sore throat. Denies chest congestion, productive cough or wheezing. Denies chest pains, palpitations and leg swelling Denies abdominal pain, nausea, vomiting,diarrhea or constipation.   Denies dysuria, frequency, hesitancy or incontinence. Denies joint pain, swelling and limitation in mobility. Denies headaches, seizures, numbness, or tingling. Denies depression, anxiety or insomnia.    PE  BP 120/75 (BP Location: Right Arm, Patient Position: Sitting, Cuff Size: Large)   Pulse 95   Ht 5\' 8"  (1.727 m)   Wt 201 lb (91.2 kg)   SpO2 95%   BMI 30.56 kg/m   Patient alert and oriented and in no cardiopulmonary distress.  HEENT: No facial asymmetry, EOMI,     Neck supple .  Chest: Clear to auscultation bilaterally.  CVS: S1, S2 no murmurs, no S3.Regular rate.  ABD: Soft non tender.   Ext: No edema  MS: Adequate ROM spine, shoulders, hips and knees.  Psych: Good eye contact, normal affect. Memory intact not anxious or depressed appearing.  CNS: CN 2-12 intact, power,  normal throughout.no focal deficits noted.   Assessment & Plan  Mild persistent asthma, uncomplicated Stable chronic medical condition Continue albuterol inhaler as needed, states that he seldomly  has needs for his  inhaler  Chronic allergic rhinitis due to animal hair and dander Chronic uncontrolled condition Has used singular and Flonase in the past Currently on Claritin 10 mg daily Patient referred to allergist  Depression, major, single episode, mild ([facility]) Chronic condition well-controlled PHQ-9 score 0 Continue Prozac 60 mg daily Denies SI, HI  Obesity (BMI 30-39.9) Wt Readings from Last 3 Encounters:  07/22/21 201 lb (91.2 kg)  01/18/21 208 lb 1.9 oz (94.4 kg)  08/18/20 197 lb (89.4 kg)  Patient stated that he does a lot of walking on the job but does not exercise regularly. Need to increase intake of whole food consisting mainly vegetables and protein less carbohydrate drinking at least 64 ounces of water daily, engaging in regular vigorous exercises at least 150 minutes weekly, importance of pressure control also discussed with patient he verbalized understanding

## 2021-07-22 NOTE — Patient Instructions (Addendum)
Please get your fasting labs done 3-5 days before your next visit. ° ° °It is important that you exercise regularly at least 30 minutes 5 times a week.  °Think about what you will eat, plan ahead. °Choose " clean, green, fresh or frozen" over canned, processed or packaged foods which are more sugary, salty and fatty. °70 to 75% of food eaten should be vegetables and fruit. °Three meals at set times with snacks allowed between meals, but they must be fruit or vegetables. °Aim to eat over a 12 hour period , example 7 am to 7 pm, and STOP after  your last meal of the day. °Drink water,generally about 64 ounces per day, no other drink is as healthy. Fruit juice is best enjoyed in a healthy way, by EATING the fruit. ° °Thanks for choosing [facility], we consider it a privelige to serve you.  °

## 2021-07-22 NOTE — Assessment & Plan Note (Signed)
Stable chronic medical condition Continue albuterol inhaler as needed, states that he seldomly  has needs for his inhaler

## 2021-08-15 ENCOUNTER — Other Ambulatory Visit: Payer: Self-pay | Admitting: Nurse Practitioner

## 2021-10-19 ENCOUNTER — Ambulatory Visit: Payer: Self-pay | Admitting: Allergy & Immunology

## 2021-10-21 ENCOUNTER — Ambulatory Visit: Payer: Self-pay | Admitting: Allergy & Immunology

## 2022-01-16 DIAGNOSIS — E785 Hyperlipidemia, unspecified: Secondary | ICD-10-CM | POA: Diagnosis not present

## 2022-01-16 DIAGNOSIS — R7303 Prediabetes: Secondary | ICD-10-CM | POA: Diagnosis not present

## 2022-01-16 DIAGNOSIS — E559 Vitamin D deficiency, unspecified: Secondary | ICD-10-CM | POA: Diagnosis not present

## 2022-01-16 DIAGNOSIS — E669 Obesity, unspecified: Secondary | ICD-10-CM | POA: Diagnosis not present

## 2022-01-17 LAB — CMP14+EGFR
ALT: 18 IU/L (ref 0–44)
AST: 18 IU/L (ref 0–40)
Albumin/Globulin Ratio: 2 (ref 1.2–2.2)
Albumin: 4.7 g/dL (ref 4.3–5.2)
Alkaline Phosphatase: 81 IU/L (ref 44–121)
BUN/Creatinine Ratio: 16 (ref 9–20)
BUN: 16 mg/dL (ref 6–20)
Bilirubin Total: 0.5 mg/dL (ref 0.0–1.2)
CO2: 23 mmol/L (ref 20–29)
Calcium: 9.7 mg/dL (ref 8.7–10.2)
Chloride: 101 mmol/L (ref 96–106)
Creatinine, Ser: 0.97 mg/dL (ref 0.76–1.27)
Globulin, Total: 2.4 g/dL (ref 1.5–4.5)
Glucose: 87 mg/dL (ref 70–99)
Potassium: 4.3 mmol/L (ref 3.5–5.2)
Sodium: 138 mmol/L (ref 134–144)
Total Protein: 7.1 g/dL (ref 6.0–8.5)
eGFR: 112 mL/min/{1.73_m2} (ref >59)

## 2022-01-17 LAB — CBC WITH DIFFERENTIAL/PLATELET
Basophils Absolute: 0 10*3/uL (ref 0.0–0.2)
Basos: 1 %
EOS (ABSOLUTE): 0.1 10*3/uL (ref 0.0–0.4)
Eos: 2 %
Hematocrit: 43.3 % (ref 37.5–51.0)
Hemoglobin: 14.6 g/dL (ref 13.0–17.7)
Immature Grans (Abs): 0 10*3/uL (ref 0.0–0.1)
Immature Granulocytes: 1 %
Lymphocytes Absolute: 2.5 10*3/uL (ref 0.7–3.1)
Lymphs: 38 %
MCH: 28.9 pg (ref 26.6–33.0)
MCHC: 33.7 g/dL (ref 31.5–35.7)
MCV: 86 fL (ref 79–97)
Monocytes Absolute: 0.5 10*3/uL (ref 0.1–0.9)
Monocytes: 8 %
Neutrophils Absolute: 3.3 10*3/uL (ref 1.4–7.0)
Neutrophils: 50 %
Platelets: 298 10*3/uL (ref 150–450)
RBC: 5.06 x10E6/uL (ref 4.14–5.80)
RDW: 12 % (ref 11.6–15.4)
WBC: 6.5 10*3/uL (ref 3.4–10.8)

## 2022-01-17 LAB — LIPID PANEL
Chol/HDL Ratio: 4.4 ratio (ref 0.0–5.0)
Cholesterol, Total: 199 mg/dL (ref 100–199)
HDL: 45 mg/dL (ref >39)
LDL Chol Calc (NIH): 140 mg/dL — ABNORMAL HIGH (ref 0–99)
Triglycerides: 76 mg/dL (ref 0–149)
VLDL Cholesterol Cal: 14 mg/dL (ref 5–40)

## 2022-01-17 LAB — TSH: TSH: 0.885 u[IU]/mL (ref 0.450–4.500)

## 2022-01-17 LAB — VITAMIN D 25 HYDROXY (VIT D DEFICIENCY, FRACTURES): Vit D, 25-Hydroxy: 13.2 ng/mL — ABNORMAL LOW (ref 30.0–100.0)

## 2022-01-17 LAB — HEMOGLOBIN A1C
Est. average glucose Bld gHb Est-mCnc: 117 mg/dL
Hgb A1c MFr Bld: 5.7 % — ABNORMAL HIGH (ref 4.8–5.6)

## 2022-01-18 ENCOUNTER — Other Ambulatory Visit: Payer: Self-pay | Admitting: Internal Medicine

## 2022-01-18 DIAGNOSIS — E559 Vitamin D deficiency, unspecified: Secondary | ICD-10-CM

## 2022-01-18 MED ORDER — VITAMIN D (ERGOCALCIFEROL) 1.25 MG (50000 UNIT) PO CAPS: 50000.0000 [IU] | ORAL_CAPSULE | ORAL | 0 refills | Status: DC

## 2022-01-22 ENCOUNTER — Other Ambulatory Visit: Payer: Self-pay | Admitting: Nurse Practitioner

## 2022-01-23 ENCOUNTER — Encounter: Payer: BC Managed Care – PPO | Admitting: Nurse Practitioner

## 2022-01-24 ENCOUNTER — Encounter: Payer: BC Managed Care – PPO | Admitting: Internal Medicine

## 2022-01-25 ENCOUNTER — Encounter: Payer: BC Managed Care – PPO | Admitting: Internal Medicine

## 2022-01-27 ENCOUNTER — Ambulatory Visit (INDEPENDENT_AMBULATORY_CARE_PROVIDER_SITE_OTHER): Payer: BC Managed Care – PPO | Admitting: Internal Medicine

## 2022-01-27 ENCOUNTER — Encounter: Payer: Self-pay | Admitting: Internal Medicine

## 2022-01-27 VITALS — BP 122/86 | HR 95 | Ht 66.0 in | Wt 205.2 lb

## 2022-01-27 DIAGNOSIS — J4530 Mild persistent asthma, uncomplicated: Secondary | ICD-10-CM

## 2022-01-27 DIAGNOSIS — R7303 Prediabetes: Secondary | ICD-10-CM | POA: Diagnosis not present

## 2022-01-27 DIAGNOSIS — E559 Vitamin D deficiency, unspecified: Secondary | ICD-10-CM | POA: Diagnosis not present

## 2022-01-27 DIAGNOSIS — Z0001 Encounter for general adult medical examination with abnormal findings: Secondary | ICD-10-CM | POA: Diagnosis not present

## 2022-01-27 DIAGNOSIS — F320 Major depressive disorder, single episode, mild: Secondary | ICD-10-CM

## 2022-01-27 DIAGNOSIS — E785 Hyperlipidemia, unspecified: Secondary | ICD-10-CM | POA: Diagnosis not present

## 2022-01-27 DIAGNOSIS — Z23 Encounter for immunization: Secondary | ICD-10-CM

## 2022-01-27 NOTE — Progress Notes (Signed)
Complete physical exam  Patient: Jimmy Landry   DOB: [birth year]   21 y.o. Male  MRN: [id number]  Subjective:    Chief Complaint  Patient presents with   Annual Exam    Jimmy Landry is a 21 y.o. male who presents today for a complete physical exam. He reports consuming a general diet. The patient has a physically strenuous job, but has no regular exercise apart from work.  He generally feels well. He reports sleeping fairly well. He does not have additional problems to discuss today.    Most recent fall risk assessment:    01/27/2022    9:02 AM  Fall Risk   Falls in the past year? 0  Number falls in past yr: 0  Injury with Fall? 0  Risk for fall due to : No Fall Risks  Follow up Falls evaluation completed     Most recent depression screenings:    01/27/2022    9:02 AM 07/22/2021    9:28 AM  PHQ 2/9 Scores  PHQ - 2 Score 0 0  PHQ- 9 Score 1     Vision:Not within last year  and Dental: Current dental problems and Receives regular dental care  Past Medical History:  Diagnosis Date   Allergy    Anxiety    Asthma    Depression, major, single episode, mild ([place]) 12/21/2016   Insomnia 12/21/2016   Otic fungal infection 03/13/2019   Past Surgical History:  Procedure Laterality Date   ESOPHAGOGASTRODUODENOSCOPY     for penny removal    NO PAST SURGERIES     TYMPANOSTOMY TUBE PLACEMENT     WISDOM TOOTH EXTRACTION     Social History   Tobacco Use   Smoking status: Never   Smokeless tobacco: Never  Vaping Use   Vaping Use: Never used  Substance Use Topics   Alcohol use: Yes   Drug use: No   Family History  Problem Relation Age of Onset   Allergic rhinitis Mother    Asthma Mother    Heart disease Mother    Depression Mother    Diabetes Mother    Hypertension Mother    Hyperlipidemia Mother    Miscarriages / [place] Mother    Cancer Father        lung   Learning disabilities Father    COPD Father    Heart disease Sister 6       birth defect  heart   Heart disease Maternal Grandmother    Cancer Maternal Grandfather        lung cancer to brain   Heart disease Paternal Grandmother    Angioedema Neg Hx    Atopy Neg Hx    Eczema Neg Hx    Immunodeficiency Neg Hx    Urticaria Neg Hx    No Known Allergies   Patient Care Team: Johnette Abraham, MD as PCP - General (Internal Medicine)   Outpatient Medications Prior to Visit  Medication Sig   albuterol (VENTOLIN HFA) 108 (90 Base) MCG/ACT inhaler Inhale 2 puffs into the lungs every 4 (four) hours as needed for wheezing or shortness of breath.   FLUoxetine (PROZAC) 20 MG capsule TAKE THREE CAPSULES BY MOUTH EVERY DAY   loratadine (CLARITIN) 10 MG tablet Take 1 tablet (10 mg total) by mouth daily.   VITAMIN D PO Take 10,000 Units by mouth daily.   [DISCONTINUED] Vitamin D, Ergocalciferol, (DRISDOL) 1.25 MG (50000 UNIT) CAPS capsule Take 1 capsule (50,000 Units total)  by mouth every 7 (seven) days for 12 doses.   Facility-Administered Medications Prior to Visit  Medication Dose Route Frequency Provider   polyethylene glycol powder (GLYCOLAX/MIRALAX) container 255 g  1 Container Oral Once Perlie Mayo, NP   Review of Systems  Constitutional:  Negative for chills and fever.  HENT:  Negative for sore throat.   Respiratory:  Negative for cough and shortness of breath.   Cardiovascular:  Negative for chest pain, palpitations and leg swelling.  Gastrointestinal:  Negative for abdominal pain, blood in stool, constipation, diarrhea, nausea and vomiting.  Genitourinary:  Negative for dysuria and hematuria.  Musculoskeletal:  Negative for myalgias.  Skin:  Negative for itching and rash.  Neurological:  Negative for dizziness and headaches.  Psychiatric/Behavioral:  Negative for depression and suicidal ideas.       Objective:     BP 122/86   Pulse 95   Ht _0  (1.676 m)   Wt 205 lb 3.2 oz (93.1 kg)   SpO2 94%   BMI 33.12 kg/m  BP Readings from Last 3 Encounters:  01/27/22  122/86  07/22/21 120/75  01/18/21 (!) 144/84   Physical Exam Vitals reviewed.  Constitutional:      General: He is not in acute distress.    Appearance: Normal appearance. He is not ill-appearing.  HENT:     Head: Normocephalic and atraumatic.     Right Ear: Tympanic membrane, ear canal and external ear normal. There is no impacted cerumen.     Left Ear: Tympanic membrane, ear canal and external ear normal. There is no impacted cerumen.     Nose: Nose normal. No congestion or rhinorrhea.     Mouth/Throat:     Mouth: Mucous membranes are moist.     Pharynx: Oropharynx is clear.  Eyes:     General: No scleral icterus.    Extraocular Movements: Extraocular movements intact.     Conjunctiva/sclera: Conjunctivae normal.     Pupils: Pupils are equal, round, and reactive to light.  Cardiovascular:     Rate and Rhythm: Normal rate and regular rhythm.     Pulses: Normal pulses.     Heart sounds: Normal heart sounds. No murmur heard. Pulmonary:     Effort: Pulmonary effort is normal.     Breath sounds: Normal breath sounds. No wheezing, rhonchi or rales.  Abdominal:     General: Abdomen is flat. Bowel sounds are normal. There is no distension.     Palpations: Abdomen is soft.     Tenderness: There is no abdominal tenderness.  Musculoskeletal:        General: No swelling or deformity. Normal range of motion.     Cervical back: Normal range of motion.  Skin:    General: Skin is warm and dry.     Capillary Refill: Capillary refill takes less than 2 seconds.  Neurological:     General: No focal deficit present.     Mental Status: He is alert and oriented to person, place, and time.     Motor: No weakness.  Psychiatric:        Mood and Affect: Mood normal.        Behavior: Behavior normal.        Thought Content: Thought content normal.     Last CBC Lab Results  Component Value Date   WBC 6.5 01/16/2022   HGB 14.6 01/16/2022   HCT 43.3 01/16/2022   MCV 86 01/16/2022   MCH  28.9 01/16/2022  RDW 12.0 01/16/2022   PLT 298 53/64/6803   Last metabolic panel Lab Results  Component Value Date   GLUCOSE 87 01/16/2022   NA 138 01/16/2022   K 4.3 01/16/2022   CL 101 01/16/2022   CO2 23 01/16/2022   BUN 16 01/16/2022   CREATININE 0.97 01/16/2022   EGFR 112 01/16/2022   CALCIUM 9.7 01/16/2022   PROT 7.1 01/16/2022   ALBUMIN 4.7 01/16/2022   LABGLOB 2.4 01/16/2022   AGRATIO 2.0 01/16/2022   BILITOT 0.5 01/16/2022   ALKPHOS 81 01/16/2022   AST 18 01/16/2022   ALT 18 01/16/2022   Last lipids Lab Results  Component Value Date   CHOL 199 01/16/2022   HDL 45 01/16/2022   LDLCALC 140 (H) 01/16/2022   TRIG 76 01/16/2022   CHOLHDL 4.4 01/16/2022   Last hemoglobin A1c Lab Results  Component Value Date   HGBA1C 5.7 (H) 01/16/2022   Last thyroid functions Lab Results  Component Value Date   TSH 0.885 01/16/2022   Last vitamin D Lab Results  Component Value Date   VD25OH 13.2 (L) 01/16/2022       Assessment & Plan:    Routine Health Maintenance and Physical Exam  Immunization History  Administered Date(s) Administered   DTaP 05/31/1998, 08/05/1998, 10/05/1998, 06/28/1999, 06/22/2003   HIB (PRP-OMP) 05/31/1998, 08/05/1998, 10/05/1998, 06/28/1999   Hepatitis A 06/08/2005, 09/03/2008   Hepatitis B 05/31/1998, 08/05/1998, 10/05/1998   Hpv-Unspecified 03/16/2011, 08/22/2011, 05/15/2012   IPV 05/31/1998, 08/05/1998, 04/07/1999, 06/22/2003   Influenza,inj,Quad PF,6+ Mos 12/21/2016, 12/18/2018, 03/11/2020   MMR 04/07/1999, 06/22/2003   Meningococcal B, OMV 09/24/2014, 12/01/2014   Meningococcal Conjugate 11/08/2009, 09/24/2014   Moderna Sars-Covid-2 Vaccination 09/26/2019, 10/15/2019   Pneumococcal Conjugate-13 06/28/1999, 09/30/1999   Pneumococcal Polysaccharide-23 08/18/2020   Tdap 11/08/2009, 09/10/2018   Varicella 04/07/1999, 06/08/2005    Health Maintenance  Topic Date Due   INFLUENZA VACCINE  09/06/2021   DTaP/Tdap/Td (8 - Td or Tdap)  09/09/2028   HPV VACCINES  Completed   Hepatitis C Screening  Completed   HIV Screening  Completed   COVID-19 Vaccine  Discontinued    Discussed health benefits of physical activity, and encouraged him to engage in regular exercise appropriate for his age and condition.  Problem List Items Addressed This Visit       Mild persistent asthma, uncomplicated    Asymptomatic currently.  Unremarkable pulmonary exam today.  He has not needed to use his albuterol inhaler recently.      Depression, major, single episode, mild ([facility])    Mood stable currently.  Well-controlled on Prozac 20 mg 3 times daily.      Hyperlipidemia LDL goal <100    LDL remains elevated, 140 on 12/11.  He was counseled on limiting fatty/fried foods and provided information regarding the Mediterranean diet.      Encounter for well adult exam with abnormal findings    Presenting today for his annual exam.  Interval records and labs been reviewed. -Influenza vaccine administered today -We will tentatively plan for follow-up in 1 year      Prediabetes    A1c 5.7 on labs from 12/11.  We reviewed the need to limit fatty/fried foods in order to mitigate progression to diabetes.  We also reviewed the national recommendation for at least 150 minutes of moderate intensity exercise on a weekly basis.      Vitamin D deficiency - Primary    Noted on recent labs.  He was prescribed high-dose, weekly supplementation x 12  weeks.  Will plan to repeat his vitamin D level upon completion of supplementation.      Need for influenza vaccination    Influenza vaccine administered today      Return in about 1 year (around 01/28/2023) for CPE.     Johnette Abraham, MD

## 2022-01-27 NOTE — Assessment & Plan Note (Signed)
LDL remains elevated, 140 on 12/11.  He was counseled on limiting fatty/fried foods and provided information regarding the Mediterranean diet.

## 2022-01-27 NOTE — Assessment & Plan Note (Signed)
Mood stable currently.  Well-controlled on Prozac 20 mg 3 times daily.

## 2022-01-27 NOTE — Patient Instructions (Signed)
It was a pleasure to see you today.  Thank you for giving [place] the opportunity to be involved in your care.  Below is a brief recap of your visit and next steps.  We will plan to see you again in 1 year.  Summary We completed your annual exam today.  As we discussed, your labs suggest that you should limit fatty / fried foods and start exercising regularly. Please see the attached information on the Mediterranean diet. We need to repeat your vitamin D level when you complete 12 weeks of supplementation. Please see the lab order attached and bring it with you for repeat labs when you finish your vitamin D. Follow up in 1 year

## 2022-01-27 NOTE — Assessment & Plan Note (Signed)
Asymptomatic currently.  Unremarkable pulmonary exam today.  He has not needed to use his albuterol inhaler recently.

## 2022-01-27 NOTE — Assessment & Plan Note (Signed)
Presenting today for his annual exam.  Interval records and labs been reviewed. -Influenza vaccine administered today -We will tentatively plan for follow-up in 1 year

## 2022-01-27 NOTE — Assessment & Plan Note (Signed)
Influenza vaccine administered today.

## 2022-01-27 NOTE — Assessment & Plan Note (Signed)
Noted on recent labs.  He was prescribed high-dose, weekly supplementation x 12 weeks.  Will plan to repeat his vitamin D level upon completion of supplementation.

## 2022-01-27 NOTE — Assessment & Plan Note (Addendum)
A1c 5.7 on labs from 12/11.  We reviewed the need to limit fatty/fried foods in order to mitigate progression to diabetes.  We also reviewed the national recommendation for at least 150 minutes of moderate intensity exercise on a weekly basis.

## 2022-04-20 ENCOUNTER — Other Ambulatory Visit: Payer: Self-pay

## 2022-04-20 ENCOUNTER — Encounter (HOSPITAL_COMMUNITY): Payer: Self-pay

## 2022-04-20 ENCOUNTER — Emergency Department (HOSPITAL_COMMUNITY)
Admission: EM | Admit: 2022-04-20 | Discharge: 2022-04-20 | Disposition: A | Discharge status: Home / Self Care | Payer: BC Managed Care – PPO | Attending: Emergency Medicine | Admitting: Emergency Medicine

## 2022-04-20 ENCOUNTER — Emergency Department (HOSPITAL_COMMUNITY): Payer: BC Managed Care – PPO

## 2022-04-20 DIAGNOSIS — J45909 Unspecified asthma, uncomplicated: Secondary | ICD-10-CM

## 2022-04-20 DIAGNOSIS — R059 Cough, unspecified: Secondary | ICD-10-CM | POA: Diagnosis not present

## 2022-04-20 DIAGNOSIS — J069 Acute upper respiratory infection, unspecified: Secondary | ICD-10-CM | POA: Insufficient documentation

## 2022-04-20 DIAGNOSIS — B9789 Other viral agents as the cause of diseases classified elsewhere: Secondary | ICD-10-CM | POA: Diagnosis not present

## 2022-04-20 MED ORDER — PREDNISONE 50 MG PO TABS: ORAL_TABLET | ORAL | 0 refills | Status: DC

## 2022-04-20 MED ORDER — ALBUTEROL SULFATE HFA 108 (90 BASE) MCG/ACT IN AERS
2.0000 | INHALATION_SPRAY | Freq: Once | RESPIRATORY_TRACT | Status: AC
  Administered 2022-04-20: 2 via RESPIRATORY_TRACT
  Filled 2022-04-20: qty 6.7

## 2022-04-20 MED ORDER — PREDNISONE 50 MG PO TABS
60.0000 mg | ORAL_TABLET | Freq: Every day | ORAL | Status: DC
  Administered 2022-04-20: 60 mg via ORAL
  Filled 2022-04-20: qty 1

## 2022-04-20 NOTE — ED Provider Notes (Signed)
[facility] Provider Note   CSN: [id number] Arrival date & time: 04/20/22  2102     History  Chief Complaint  Patient presents with   Cough    Jimmy Landry is a 22 y.o. male.  Patient complains of a cough for the past week.  Patient denies shortness of breath.  Patient reports he was exposed to his mother who was recently diagnosed with bronchitis.  Patient denies any fever or chills.  Patient has a past medical history of asthma he is on albuterol  The history is provided by the patient. No language interpreter was used.  Cough Cough characteristics:  Non-productive Sputum characteristics:  Nondescript Severity:  Moderate Onset quality:  Gradual Duration:  1 week Timing:  Constant Progression:  Worsening Chronicity:  New Context: sick contacts   Relieved by:  Nothing Worsened by:  Nothing      Home Medications Prior to Admission medications   Medication Sig Start Date End Date Taking? Authorizing Provider  albuterol (VENTOLIN HFA) 108 (90 Base) MCG/ACT inhaler Inhale 2 puffs into the lungs every 4 (four) hours as needed for wheezing or shortness of breath. 09/10/19   Perlie Mayo, NP  FLUoxetine (PROZAC) 20 MG capsule TAKE THREE CAPSULES BY MOUTH EVERY DAY 01/25/22   Johnette Abraham, MD  loratadine (CLARITIN) 10 MG tablet Take 1 tablet (10 mg total) by mouth daily. 08/18/20   Fayrene Helper, MD  VITAMIN D PO Take 10,000 Units by mouth daily.    [provider]      Allergies    Patient has no known allergies.    Review of Systems   Review of Systems  Respiratory:  Positive for cough.   All other systems reviewed and are negative.   Physical Exam Updated Vital Signs BP 110/77 (BP Location: Right Arm)   Pulse 93   Temp 98.6 F (37 C) (Oral)   Resp 16   Ht '5\' 6"'$  (1.676 m)   Wt 90.7 kg   SpO2 94%   BMI 32.28 kg/m  Physical Exam Vitals and nursing note reviewed.  Constitutional:       Appearance: He is well-developed.  HENT:     Head: Normocephalic.     Mouth/Throat:     Mouth: Mucous membranes are moist.  Cardiovascular:     Rate and Rhythm: Normal rate.  Pulmonary:     Effort: Pulmonary effort is normal.  Abdominal:     General: There is no distension.  Musculoskeletal:        General: Normal range of motion.     Cervical back: Normal range of motion.  Neurological:     General: No focal deficit present.     Mental Status: He is alert and oriented to person, place, and time.     ED Results / Procedures / Treatments   Labs (all labs ordered are listed, but only abnormal results are displayed) Labs Reviewed - No data to display  EKG None  Radiology DG Chest 2 View  Result Date: 04/20/2022 CLINICAL DATA:  Cough and congestion for 1 week. EXAM: CHEST - 2 VIEW COMPARISON:  02/27/2016. FINDINGS: The heart size and mediastinal contours are within normal limits. Both lungs are clear. No acute osseous abnormality. IMPRESSION: No active cardiopulmonary disease. Electronically Signed   By: Brett Fairy M.D.   On: 04/20/2022 22:12    Procedures Procedures    Medications Ordered in ED Medications - No data to  display  ED Course/ Medical Decision Making/ A&P                             Medical Decision Making Complains of cough for the past week  Amount and/or Complexity of Data Reviewed Radiology: ordered and independent interpretation performed. Decision-making details documented in ED Course.    Details: Chest x-ray shows no evidence of pneumonia  Risk Prescription drug management. Risk Details: Patient has a history of asthma he has had a cough for the past week patient is given an albuterol inhaler and 60 mg of prednisone patient is given a prescription for prednisone for 5 days patient is advised to follow-up with his primary care physician for recheck if symptoms persist           Final Clinical Impression(s) / ED Diagnoses Final  diagnoses:  Viral URI with cough  Uncomplicated asthma, unspecified asthma severity, unspecified whether persistent    Rx / DC Orders ED Discharge Orders          Ordered    predniSONE (DELTASONE) 50 MG tablet        04/20/22 2252           An After Visit Summary was printed and given to the patient.    Sidney Ace 04/20/22 2253    Carmin Muskrat, MD 04/21/22 2125

## 2022-04-20 NOTE — ED Triage Notes (Signed)
Complaining of a cough for weeks, has been taking dayquil and nyquil for it with no relief.

## 2022-04-27 ENCOUNTER — Encounter: Payer: Self-pay | Admitting: Internal Medicine

## 2022-04-27 ENCOUNTER — Telehealth: Payer: BC Managed Care – PPO | Admitting: Internal Medicine

## 2022-04-27 DIAGNOSIS — J4530 Mild persistent asthma, uncomplicated: Secondary | ICD-10-CM

## 2022-04-27 MED ORDER — PROMETHAZINE-DM 6.25-15 MG/5ML PO SYRP: 2.5000 mL | ORAL_SOLUTION | Freq: Four times a day (QID) | ORAL | 0 refills | Status: AC | PRN

## 2022-04-27 MED ORDER — PREDNISONE 20 MG PO TABS: 40.0000 mg | ORAL_TABLET | Freq: Every day | ORAL | 0 refills | Status: AC

## 2022-04-27 MED ORDER — FLUTICASONE-SALMETEROL 250-50 MCG/ACT IN AEPB: 1.0000 | INHALATION_SPRAY | Freq: Two times a day (BID) | RESPIRATORY_TRACT | 3 refills | Status: DC

## 2022-04-27 NOTE — Progress Notes (Signed)
Virtual Visit via Video Note  I connected with Jimmy Landry on 05/03/22 at 10:20 AM EDT by a video enabled telemedicine application and verified that I am speaking with the correct person using two identifiers.  Patient Location: Home Provider Location: Office/Clinic  I discussed the limitations, risks, security, and privacy concerns of performing an evaluation and management service by video and the availability of in person appointments. I also discussed with the patient that there may be a patient responsible charge related to this service. The patient expressed understanding and agreed to proceed.  Subjective: PCP: Johnette Abraham, MD  Chief Complaint  Patient presents with   Cough    Congestion and cough that's deep since 04/06/2022.   Mr. Jimmy Landry has been evaluated today through acute video encounter for persistent cough and congestion now present x 3 weeks.  He presented to the emergency department on 3/14 endorsing 1 week history of cough.  He denies shortness of breath at that time and stated that he had recently been exposed to family members with viral URI.  At that time he was treated with prednisone and instructed to follow-up with his PCP if symptoms persisted.  Mr. Jimmy Landry states that his symptoms have not significantly improved despite treatment with prednisone.  He continues to endorse cough with scant sputum production and deep chest congestion.  He continues to use his rescue inhaler frequently.  He is interested in additional treatment options today.  Mr. Jimmy Landry denies further associated symptoms including fever/chills, sinus/nasal congestion, headache, fatigue, and nausea/vomiting, or diarrhea.   ROS: Per HPI  Current Outpatient Medications:    albuterol (VENTOLIN HFA) 108 (90 Base) MCG/ACT inhaler, Inhale 2 puffs into the lungs every 4 (four) hours as needed for wheezing or shortness of breath., Disp: 18 g, Rfl: 3   FLUoxetine (PROZAC) 20 MG capsule, TAKE THREE CAPSULES  BY MOUTH EVERY DAY, Disp: 90 capsule, Rfl: 4   fluticasone-salmeterol (ADVAIR) 250-50 MCG/ACT AEPB, Inhale 1 puff into the lungs in the morning and at bedtime., Disp: 14 each, Rfl: 3   loratadine (CLARITIN) 10 MG tablet, Take 1 tablet (10 mg total) by mouth daily., Disp: 30 tablet, Rfl: 11   promethazine-dextromethorphan (PROMETHAZINE-DM) 6.25-15 MG/5ML syrup, Take 2.5 mLs by mouth 4 (four) times daily as needed for cough., Disp: 118 mL, Rfl: 0   VITAMIN D PO, Take 10,000 Units by mouth daily., Disp: , Rfl:   Current Facility-Administered Medications:    polyethylene glycol powder (GLYCOLAX/MIRALAX) container 255 g, 1 Container, Oral, Once, Perlie Mayo, NP  Assessment and Plan:  Mild persistent asthma, uncomplicated Assessment & Plan: Evaluated today for persistent cough with scant sputum production and deep chest congestion.  Symptoms have not improved despite recent treatment with prednisone.  He continues to require his albuterol inhaler multiple times per day. -Start Advair as a daily maintenance inhaler -Will prescribe additional prednisone 40 mg x 5 days -Promethazine-DM prescribed for cough relief at night -He was instructed to present for in person evaluation if his symptoms worsen or fail to improve despite treatment measures added today  Follow Up Instructions: Return if symptoms worsen or fail to improve.   I discussed the assessment and treatment plan with the patient. The patient was provided an opportunity to ask questions, and all were answered. The patient agreed with the plan and demonstrated an understanding of the instructions.   The patient was advised to call back or seek an in-person evaluation if the symptoms worsen or if the  condition fails to improve as anticipated.  The above assessment and management plan was discussed with the patient. The patient verbalized understanding of and has agreed to the management plan.   Johnette Abraham, MD

## 2022-05-02 ENCOUNTER — Other Ambulatory Visit: Payer: Self-pay | Admitting: Internal Medicine

## 2022-05-02 DIAGNOSIS — J4530 Mild persistent asthma, uncomplicated: Secondary | ICD-10-CM

## 2022-05-03 ENCOUNTER — Encounter: Payer: Self-pay | Admitting: Internal Medicine

## 2022-05-03 NOTE — Assessment & Plan Note (Signed)
Evaluated today for persistent cough with scant sputum production and deep chest congestion.  Symptoms have not improved despite recent treatment with prednisone.  He continues to require his albuterol inhaler multiple times per day. -Start Advair as a daily maintenance inhaler -Will prescribe additional prednisone 40 mg x 5 days -Promethazine-DM prescribed for cough relief at night -He was instructed to present for in person evaluation if his symptoms worsen or fail to improve despite treatment measures added today

## 2022-05-12 ENCOUNTER — Telehealth: Payer: BC Managed Care – PPO | Admitting: Family Medicine

## 2022-07-12 ENCOUNTER — Other Ambulatory Visit: Payer: Self-pay | Admitting: Internal Medicine

## 2022-11-13 ENCOUNTER — Other Ambulatory Visit: Payer: Self-pay | Admitting: Internal Medicine

## 2022-11-13 DIAGNOSIS — J4530 Mild persistent asthma, uncomplicated: Secondary | ICD-10-CM

## 2023-01-20 ENCOUNTER — Other Ambulatory Visit: Payer: Self-pay | Admitting: Internal Medicine

## 2023-01-29 ENCOUNTER — Encounter: Payer: BC Managed Care – PPO | Admitting: Internal Medicine

## 2023-02-27 ENCOUNTER — Encounter: Payer: Self-pay | Admitting: Internal Medicine

## 2023-02-27 ENCOUNTER — Ambulatory Visit (INDEPENDENT_AMBULATORY_CARE_PROVIDER_SITE_OTHER): Payer: BC Managed Care – PPO | Admitting: Internal Medicine

## 2023-02-27 VITALS — BP 136/86 | HR 76 | Ht 66.0 in | Wt 203.4 lb

## 2023-02-27 DIAGNOSIS — J4530 Mild persistent asthma, uncomplicated: Secondary | ICD-10-CM

## 2023-02-27 DIAGNOSIS — Z23 Encounter for immunization: Secondary | ICD-10-CM | POA: Diagnosis not present

## 2023-02-27 DIAGNOSIS — R7303 Prediabetes: Secondary | ICD-10-CM

## 2023-02-27 DIAGNOSIS — E669 Obesity, unspecified: Secondary | ICD-10-CM | POA: Diagnosis not present

## 2023-02-27 DIAGNOSIS — E559 Vitamin D deficiency, unspecified: Secondary | ICD-10-CM

## 2023-02-27 DIAGNOSIS — F320 Major depressive disorder, single episode, mild: Secondary | ICD-10-CM

## 2023-02-27 DIAGNOSIS — E785 Hyperlipidemia, unspecified: Secondary | ICD-10-CM

## 2023-02-27 DIAGNOSIS — Z0001 Encounter for general adult medical examination with abnormal findings: Secondary | ICD-10-CM | POA: Diagnosis not present

## 2023-02-27 NOTE — Assessment & Plan Note (Signed)
Asymptomatic currently.  Pulmonary exam is unremarkable.  He continues to use Advair daily and infrequently requires albuterol.

## 2023-02-27 NOTE — Progress Notes (Signed)
Complete physical exam  Patient: Jimmy Landry   DOB: 05-Nov-2000   22 y.o. Male  MRN: [id number]  Subjective:    Chief Complaint  Patient presents with   Annual Exam    Jimmy Landry is a 23 y.o. male who presents today for a complete physical exam. He reports consuming a general diet. The patient has a physically strenuous job, but has no regular exercise apart from work.  He generally feels well. He reports sleeping well. He does not have additional problems to discuss today.    Most recent fall risk assessment:    02/27/2023    9:07 AM  Fall Risk   Falls in the past year? 0  Number falls in past yr: 0  Injury with Fall? 0  Risk for fall due to : No Fall Risks  Follow up Falls evaluation completed     Most recent depression screenings:    02/27/2023    9:07 AM 04/27/2022   10:25 AM  PHQ 2/9 Scores  PHQ - 2 Score 0 0  PHQ- 9 Score 1 1   Vision:Not within last year  and Dental: No current dental problems and Receives regular dental care  Patient Active Problem List   Diagnosis Date Noted   Encounter for well adult exam with abnormal findings 01/27/2022   Prediabetes 01/27/2022   Vitamin D deficiency 01/27/2022   Need for influenza vaccination 01/27/2022   Obesity (BMI 30-39.9) 07/22/2021   Hyperlipidemia LDL goal <100 03/14/2020   Vaccine counseling 09/10/2019   Depression, major, single episode, mild (HCC) 12/21/2016   Learning disability 12/21/2016   Mild persistent asthma, uncomplicated 12/21/2015   Chronic allergic rhinitis due to animal hair and dander 12/21/2015   Past Medical History:  Diagnosis Date   Allergy    Anxiety    Asthma    Depression, major, single episode, mild (HCC) 12/21/2016   Insomnia 12/21/2016   Otic fungal infection 03/13/2019   Past Surgical History:  Procedure Laterality Date   ESOPHAGOGASTRODUODENOSCOPY     for penny removal    NO PAST SURGERIES     TYMPANOSTOMY TUBE PLACEMENT     WISDOM TOOTH EXTRACTION     Social  History   Tobacco Use   Smoking status: Never   Smokeless tobacco: Never  Vaping Use   Vaping status: Never Used  Substance Use Topics   Alcohol use: Yes   Drug use: No   Family History  Problem Relation Age of Onset   Allergic rhinitis Mother    Asthma Mother    Heart disease Mother    Depression Mother    Diabetes Mother    Hypertension Mother    Hyperlipidemia Mother    Miscarriages / [place] Mother    Cancer Father        lung   Learning disabilities Father    COPD Father    Heart disease Sister 6       birth defect heart   Heart disease Maternal Grandmother    Cancer Maternal Grandfather        lung cancer to brain   Heart disease Paternal Grandmother    Angioedema Neg Hx    Atopy Neg Hx    Eczema Neg Hx    Immunodeficiency Neg Hx    Urticaria Neg Hx    No Known Allergies   Patient Care Team: Billie Lade, MD as PCP - General (Internal Medicine)   Outpatient Medications Prior to Visit  Medication Sig  albuterol (VENTOLIN HFA) 108 (90 Base) MCG/ACT inhaler Inhale 2 puffs into the lungs every 4 (four) hours as needed for wheezing or shortness of breath.   FLUoxetine (PROZAC) 20 MG capsule TAKE THREE CAPSULES BY MOUTH EVERY DAY   fluticasone-salmeterol (ADVAIR) 250-50 MCG/ACT AEPB INHALE 1 PUFF TWICE DAILY (IN THE MORNING AND AT BEDTIME)   loratadine (CLARITIN) 10 MG tablet Take 1 tablet (10 mg total) by mouth daily.   promethazine-dextromethorphan (PROMETHAZINE-DM) 6.25-15 MG/5ML syrup Take 2.5 mLs by mouth 4 (four) times daily as needed for cough.   VITAMIN D PO Take 10,000 Units by mouth daily.   Facility-Administered Medications Prior to Visit  Medication Dose Route Frequency Provider   polyethylene glycol powder (GLYCOLAX/MIRALAX) container 255 g  1 Container Oral Once Freddy Finner, NP   Review of Systems  Constitutional:  Negative for chills and fever.  HENT:  Negative for sore throat.   Respiratory:  Negative for cough and shortness of  breath.   Cardiovascular:  Negative for chest pain, palpitations and leg swelling.  Gastrointestinal:  Negative for abdominal pain, blood in stool, constipation, diarrhea, nausea and vomiting.  Genitourinary:  Negative for dysuria and hematuria.  Musculoskeletal:  Negative for myalgias.  Skin:  Negative for itching and rash.  Neurological:  Negative for dizziness and headaches.  Psychiatric/Behavioral:  Negative for depression and suicidal ideas.       Objective:     BP 136/86 (BP Location: Right Arm, Patient Position: Sitting, Cuff Size: Normal)   Pulse 76   Ht 5\' 6"  (1.676 m)   Wt 203 lb 6.4 oz (92.3 kg)   SpO2 96%   BMI 32.83 kg/m  BP Readings from Last 3 Encounters:  02/27/23 136/86  04/20/22 96/74  01/27/22 122/86   Wt Readings from Last 3 Encounters:  02/27/23 203 lb 6.4 oz (92.3 kg)  04/20/22 200 lb (90.7 kg)  01/27/22 205 lb 3.2 oz (93.1 kg)   Physical Exam Vitals reviewed.  Constitutional:      General: He is not in acute distress.    Appearance: Normal appearance. He is not ill-appearing.  HENT:     Head: Normocephalic and atraumatic.     Right Ear: Tympanic membrane, ear canal and external ear normal. There is no impacted cerumen.     Left Ear: Tympanic membrane, ear canal and external ear normal. There is no impacted cerumen.     Nose: Nose normal. No congestion or rhinorrhea.     Mouth/Throat:     Mouth: Mucous membranes are moist.     Pharynx: Oropharynx is clear.  Eyes:     General: No scleral icterus.    Extraocular Movements: Extraocular movements intact.     Conjunctiva/sclera: Conjunctivae normal.     Pupils: Pupils are equal, round, and reactive to light.  Cardiovascular:     Rate and Rhythm: Normal rate and regular rhythm.     Pulses: Normal pulses.     Heart sounds: Normal heart sounds. No murmur heard. Pulmonary:     Effort: Pulmonary effort is normal.     Breath sounds: Normal breath sounds. No wheezing, rhonchi or rales.  Abdominal:      General: Abdomen is flat. Bowel sounds are normal. There is no distension.     Palpations: Abdomen is soft.     Tenderness: There is no abdominal tenderness.  Musculoskeletal:        General: No swelling or deformity. Normal range of motion.     Cervical back: Normal  range of motion.  Skin:    General: Skin is warm and dry.     Capillary Refill: Capillary refill takes less than 2 seconds.  Neurological:     General: No focal deficit present.     Mental Status: He is alert and oriented to person, place, and time.     Motor: No weakness.  Psychiatric:        Mood and Affect: Mood normal.        Behavior: Behavior normal.        Thought Content: Thought content normal.   Last CBC Lab Results  Component Value Date   WBC 6.5 01/16/2022   HGB 14.6 01/16/2022   HCT 43.3 01/16/2022   MCV 86 01/16/2022   MCH 28.9 01/16/2022   RDW 12.0 01/16/2022   PLT 298 01/16/2022   Last metabolic panel Lab Results  Component Value Date   GLUCOSE 87 01/16/2022   NA 138 01/16/2022   K 4.3 01/16/2022   CL 101 01/16/2022   CO2 23 01/16/2022   BUN 16 01/16/2022   CREATININE 0.97 01/16/2022   EGFR 112 01/16/2022   CALCIUM 9.7 01/16/2022   PROT 7.1 01/16/2022   ALBUMIN 4.7 01/16/2022   LABGLOB 2.4 01/16/2022   AGRATIO 2.0 01/16/2022   BILITOT 0.5 01/16/2022   ALKPHOS 81 01/16/2022   AST 18 01/16/2022   ALT 18 01/16/2022   Last lipids Lab Results  Component Value Date   CHOL 199 01/16/2022   HDL 45 01/16/2022   LDLCALC 140 (H) 01/16/2022   TRIG 76 01/16/2022   CHOLHDL 4.4 01/16/2022   Last hemoglobin A1c Lab Results  Component Value Date   HGBA1C 5.7 (H) 01/16/2022   Last thyroid functions Lab Results  Component Value Date   TSH 0.885 01/16/2022   Last vitamin D Lab Results  Component Value Date   VD25OH 13.2 (L) 01/16/2022       Assessment & Plan:    Routine Health Maintenance and Physical Exam  Immunization History  Administered Date(s) Administered   DTaP  05/31/1998, 08/05/1998, 10/05/1998, 06/28/1999, 06/22/2003   HIB (PRP-OMP) 05/31/1998, 08/05/1998, 10/05/1998, 06/28/1999   Hepatitis A 06/08/2005, 09/03/2008   Hepatitis B 05/31/1998, 08/05/1998, 10/05/1998   Hpv-Unspecified 03/16/2011, 08/22/2011, 05/15/2012   IPV 05/31/1998, 08/05/1998, 04/07/1999, 06/22/2003   Influenza,inj,Quad PF,6+ Mos 12/21/2016, 12/18/2018, 03/11/2020, 01/27/2022   MMR 04/07/1999, 06/22/2003   Meningococcal B, OMV 09/24/2014, 12/01/2014   Meningococcal Conjugate 11/08/2009, 09/24/2014   Moderna Sars-Covid-2 Vaccination 09/26/2019, 10/15/2019   Pneumococcal Conjugate-13 06/28/1999, 09/30/1999   Pneumococcal Polysaccharide-23 08/18/2020   Tdap 11/08/2009, 09/10/2018   Varicella 04/07/1999, 06/08/2005    Health Maintenance  Topic Date Due   INFLUENZA VACCINE  09/07/2022   DTaP/Tdap/Td (8 - Td or Tdap) 09/09/2028   Pneumococcal Vaccine 77-85 Years old (2 of 2 - PPSV23 or PCV20) 03/30/2063   HPV VACCINES  Completed   Hepatitis C Screening  Completed   HIV Screening  Completed   COVID-19 Vaccine  Discontinued    Discussed health benefits of physical activity, and encouraged him to engage in regular exercise appropriate for his age and condition.  Problem List Items Addressed This Visit       Mild persistent asthma, uncomplicated   Asymptomatic currently.  Pulmonary exam is unremarkable.  He continues to use Advair daily and infrequently requires albuterol.      Depression, major, single episode, mild (HCC)   Mood remains stable with Prozac.  No medication changes are indicated today.  Hyperlipidemia LDL goal <100   Lipid panel updated in December 2023.  LDL 140.  He was counseled again today on limiting fatty/fried foods.  Repeat lipid panel ordered.      Encounter for well adult exam with abnormal findings   Annual physical completed today.  Previous records and labs reviewed. -Repeat labs ordered -Influenza vaccine administered today -We will  simply plan for follow-up in one year for annual physical      Prediabetes   A1c 5.7 on labs from December 2023.  He was again counseled on the need to make dietary changes in an effort to prevent progression to diabetes.  Repeat A1c ordered.      Vitamin D deficiency   Noted on previous labs.  He is currently taking daily vitamin D supplementation.  Repeat vitamin D level ordered today.      Need for influenza vaccination   Influenza vaccine administered today      Return in about 1 year (around 02/27/2024) for CPE.  Billie Lade, MD

## 2023-02-27 NOTE — Assessment & Plan Note (Signed)
Annual physical completed today.  Previous records and labs reviewed. -Repeat labs ordered -Influenza vaccine administered today -We will simply plan for follow-up in one year for annual physical

## 2023-02-27 NOTE — Patient Instructions (Signed)
It was a pleasure to see you today.  Thank you for giving [place] the opportunity to be involved in your care.  Below is a brief recap of your visit and next steps.  We will plan to see you again in 1 year.  Summary Annual physical completed today Repeat labs ordered Flu shot today Follow up for annual physical in one year

## 2023-02-27 NOTE — Assessment & Plan Note (Signed)
Noted on previous labs.  He is currently taking daily vitamin D supplementation. -Repeat vitamin D level ordered today

## 2023-02-27 NOTE — Assessment & Plan Note (Signed)
Influenza vaccine administered today.

## 2023-02-27 NOTE — Assessment & Plan Note (Signed)
Lipid panel updated in December 2023.  LDL 140.  He was counseled again today on limiting fatty/fried foods.  Repeat lipid panel ordered.

## 2023-02-27 NOTE — Assessment & Plan Note (Signed)
Mood remains stable with Prozac.  No medication changes are indicated today.

## 2023-02-27 NOTE — Assessment & Plan Note (Signed)
A1c 5.7 on labs from December 2023.  He was again counseled on the need to make dietary changes in an effort to prevent progression to diabetes.  Repeat A1c ordered.

## 2023-02-28 ENCOUNTER — Other Ambulatory Visit: Payer: Self-pay | Admitting: Internal Medicine

## 2023-02-28 ENCOUNTER — Encounter: Payer: Self-pay | Admitting: Internal Medicine

## 2023-02-28 DIAGNOSIS — E559 Vitamin D deficiency, unspecified: Secondary | ICD-10-CM

## 2023-02-28 LAB — CBC WITH DIFFERENTIAL/PLATELET
Basophils Absolute: 0 10*3/uL (ref 0.0–0.2)
Basos: 1 %
EOS (ABSOLUTE): 0.2 10*3/uL (ref 0.0–0.4)
Eos: 3 %
Hematocrit: 45.9 % (ref 37.5–51.0)
Hemoglobin: 15.3 g/dL (ref 13.0–17.7)
Immature Grans (Abs): 0 10*3/uL (ref 0.0–0.1)
Immature Granulocytes: 0 %
Lymphocytes Absolute: 1.8 10*3/uL (ref 0.7–3.1)
Lymphs: 28 %
MCH: 28.9 pg (ref 26.6–33.0)
MCHC: 33.3 g/dL (ref 31.5–35.7)
MCV: 87 fL (ref 79–97)
Monocytes Absolute: 0.6 10*3/uL (ref 0.1–0.9)
Monocytes: 10 %
Neutrophils Absolute: 3.7 10*3/uL (ref 1.4–7.0)
Neutrophils: 58 %
Platelets: 324 10*3/uL (ref 150–450)
RBC: 5.3 x10E6/uL (ref 4.14–5.80)
RDW: 11.8 % (ref 11.6–15.4)
WBC: 6.3 10*3/uL (ref 3.4–10.8)

## 2023-02-28 LAB — LIPID PANEL
Chol/HDL Ratio: 4.8 {ratio} (ref 0.0–5.0)
Cholesterol, Total: 196 mg/dL (ref 100–199)
HDL: 41 mg/dL (ref >39)
LDL Chol Calc (NIH): 130 mg/dL — ABNORMAL HIGH (ref 0–99)
Triglycerides: 140 mg/dL (ref 0–149)
VLDL Cholesterol Cal: 25 mg/dL (ref 5–40)

## 2023-02-28 LAB — CMP14+EGFR
ALT: 17 [IU]/L (ref 0–44)
AST: 14 [IU]/L (ref 0–40)
Albumin: 4.4 g/dL (ref 4.3–5.2)
Alkaline Phosphatase: 72 [IU]/L (ref 44–121)
BUN/Creatinine Ratio: 15 (ref 9–20)
BUN: 12 mg/dL (ref 6–20)
Bilirubin Total: 0.3 mg/dL (ref 0.0–1.2)
CO2: 24 mmol/L (ref 20–29)
Calcium: 10.1 mg/dL (ref 8.7–10.2)
Chloride: 101 mmol/L (ref 96–106)
Creatinine, Ser: 0.78 mg/dL (ref 0.76–1.27)
Globulin, Total: 2.3 g/dL (ref 1.5–4.5)
Glucose: 72 mg/dL (ref 70–99)
Potassium: 4.3 mmol/L (ref 3.5–5.2)
Sodium: 138 mmol/L (ref 134–144)
Total Protein: 6.7 g/dL (ref 6.0–8.5)
eGFR: 128 mL/min/{1.73_m2} (ref >59)

## 2023-02-28 LAB — VITAMIN D 25 HYDROXY (VIT D DEFICIENCY, FRACTURES): Vit D, 25-Hydroxy: 9.8 ng/mL — ABNORMAL LOW (ref 30.0–100.0)

## 2023-02-28 LAB — TSH+FREE T4
Free T4: 0.93 ng/dL (ref 0.82–1.77)
TSH: 0.508 u[IU]/mL (ref 0.450–4.500)

## 2023-02-28 LAB — HEMOGLOBIN A1C
Est. average glucose Bld gHb Est-mCnc: 120 mg/dL
Hgb A1c MFr Bld: 5.8 % — ABNORMAL HIGH (ref 4.8–5.6)

## 2023-02-28 LAB — B12 AND FOLATE PANEL
Folate: 11.9 ng/mL (ref >3.0)
Vitamin B-12: 1919 pg/mL — ABNORMAL HIGH (ref 232–1245)

## 2023-02-28 MED ORDER — VITAMIN D (ERGOCALCIFEROL) 1.25 MG (50000 UNIT) PO CAPS: 50000.0000 [IU] | ORAL_CAPSULE | ORAL | 0 refills | Status: AC

## 2023-03-18 ENCOUNTER — Other Ambulatory Visit: Payer: Self-pay | Admitting: Internal Medicine

## 2023-03-18 DIAGNOSIS — E559 Vitamin D deficiency, unspecified: Secondary | ICD-10-CM

## 2023-04-09 ENCOUNTER — Ambulatory Visit: Payer: Self-pay | Admitting: Internal Medicine

## 2023-04-09 ENCOUNTER — Ambulatory Visit
Admission: EM | Admit: 2023-04-09 | Discharge: 2023-04-09 | Disposition: A | Discharge status: Home / Self Care | Attending: Family Medicine | Admitting: Family Medicine

## 2023-04-09 DIAGNOSIS — J4521 Mild intermittent asthma with (acute) exacerbation: Secondary | ICD-10-CM

## 2023-04-09 DIAGNOSIS — J101 Influenza due to other identified influenza virus with other respiratory manifestations: Secondary | ICD-10-CM

## 2023-04-09 LAB — POC COVID19/FLU A&B COMBO
Covid Antigen, POC: NEGATIVE
Influenza A Antigen, POC: POSITIVE — AB
Influenza B Antigen, POC: NEGATIVE

## 2023-04-09 MED ORDER — OSELTAMIVIR PHOSPHATE 75 MG PO CAPS: 75.0000 mg | ORAL_CAPSULE | Freq: Two times a day (BID) | ORAL | 0 refills | Status: AC

## 2023-04-09 MED ORDER — PREDNISONE 20 MG PO TABS: 40.0000 mg | ORAL_TABLET | Freq: Every day | ORAL | 0 refills | Status: AC

## 2023-04-09 MED ORDER — PROMETHAZINE-DM 6.25-15 MG/5ML PO SYRP: 5.0000 mL | ORAL_SOLUTION | Freq: Four times a day (QID) | ORAL | 0 refills | Status: AC | PRN

## 2023-04-09 NOTE — Telephone Encounter (Signed)
 Chief Complaint: SOB Symptoms: Dry cough, fever 100 highest, chills, body aches, dizziness, headache, chest hurts when coughing Frequency: Onset Friday Pertinent Negatives: Patient denies other symptoms Disposition: [] ED /[x] Urgent Care (no appt availability in office) / [] Appointment(In office/virtual)/ []  [facility]/ [] Home Care/ [] Refused Recommended Disposition /[] [facility]/ []  Follow-up with PCP Additional Notes: Patient says since Friday he's been having a cold, SOB, low fever 100, dizziness, headache. He says his dad has the flu and everyone is sick in the home. Advised he will need to see a provider and the office is closed due to maintenance. I asked if he could do a virtual UC visit. He says he doesn't have MyChart or know how to access it, he would have to get his brother to help him or his mom. Advised since he doesn't have a way to do a virtual UC visit to go to the [facility] and wear a mask. He says he will go. His dad Gerlene Burdock got on the phone and said that Tiffany says he will just stay home and take Nyqui like he's been doing. I advised he needs to go to the UC, he says ok, he will let him know.    Reason for Disposition  [1] MILD difficulty breathing (e.g., minimal/no SOB at rest, SOB with walking, pulse <100) AND [2] NEW-onset or WORSE than normal  Answer Assessment - Initial Assessment Questions 1. RESPIRATORY STATUS: "Describe your breathing?" (e.g., wheezing, shortness of breath, unable to speak, severe coughing)      Shortness of breath, coughing 2. ONSET: "When did this breathing problem begin?"      Friday 3. PATTERN "Does the difficult breathing come and go, or has it been constant since it started?"      Comes and goes 4. SEVERITY: "How bad is your breathing?" (e.g., mild, moderate, severe)    - MILD: No SOB at rest, mild SOB with walking, speaks normally in sentences, can lie down, no retractions, pulse < 100.    - MODERATE: SOB at rest, SOB with  minimal exertion and prefers to sit, cannot lie down flat, speaks in phrases, mild retractions, audible wheezing, pulse 100-120.    - SEVERE: Very SOB at rest, speaks in single words, struggling to breathe, sitting hunched forward, retractions, pulse > 120      Mild-moderate 5. RECURRENT SYMPTOM: "Have you had difficulty breathing before?" If Yes, ask: "When was the last time?" and "What happened that time?"      No 6. CARDIAC HISTORY: "Do you have any history of heart disease?" (e.g., heart attack, angina, bypass surgery, angioplasty)      No 7. LUNG HISTORY: "Do you have any history of lung disease?"  (e.g., pulmonary embolus, asthma, emphysema)     Asthma 8. CAUSE: "What do you think is causing the breathing problem?"      Flu  9. OTHER SYMPTOMS: "Do you have any other symptoms? (e.g., dizziness, runny nose, cough, chest pain, fever)     Fever 100, body aches, chills, dizziness, headache, chest hurts when cough (dry cough)  Protocols used: Breathing Difficulty-A-AH

## 2023-04-09 NOTE — ED Triage Notes (Signed)
 Pt reports flu like sx's, dry cough, congestion, body aches, SOB, fatigued x 3 days.

## 2023-04-09 NOTE — Telephone Encounter (Signed)
 Copied from CRM [id number]. Topic: Clinical - Red Word Triage >> Apr 09, 2023  9:28 AM Philippa Chester F wrote: Red Word that prompted transfer to Nurse Triage: Increased Cough; Shortness of Breath  Patient's  father is currently experiencing a highly contagious flu. That he has passed on to six relatives. Patient is in need of prescriptions to aid in his symptoms. Patient is now experiencing all of the symptoms of his father and those symptoms are :     Symptoms:    Shortness of breath  Body aches  Chills  Fever  Dizziness (possible fainting risk)  Headaches  Chest Pain (from coughing) (lung area)  Coughing  Sneezing  Blurred vision

## 2023-04-10 NOTE — Telephone Encounter (Signed)
 Patient went to urgent care

## 2023-04-13 NOTE — ED Provider Notes (Signed)
 [facility]    CSN: [id number] Arrival date & time: 04/09/23  1100      History   Chief Complaint No chief complaint on file.   HPI Jimmy Landry is a 23 y.o. male.   Patient presenting today with 3-day history of cough, congestion, body aches, chills, fatigue, shortness of breath.  Denies chest pain, abdominal pain, vomiting, diarrhea.  So far trying over-the-counter cold and congestion medication with minimal relief.  History of asthma and seasonal allergies on as needed regimen for both.    Past Medical History:  Diagnosis Date   Allergy    Anxiety    Asthma    Depression, major, single episode, mild (HCC) 12/21/2016   Insomnia 12/21/2016   Otic fungal infection 03/13/2019    Patient Active Problem List   Diagnosis Date Noted   Encounter for well adult exam with abnormal findings 01/27/2022   Prediabetes 01/27/2022   Vitamin D deficiency 01/27/2022   Need for influenza vaccination 01/27/2022   Obesity (BMI 30-39.9) 07/22/2021   Hyperlipidemia LDL goal <100 03/14/2020   Vaccine counseling 09/10/2019   Depression, major, single episode, mild (HCC) 12/21/2016   Learning disability 12/21/2016   Mild persistent asthma, uncomplicated 12/21/2015   Chronic allergic rhinitis due to animal hair and dander 12/21/2015    Past Surgical History:  Procedure Laterality Date   ESOPHAGOGASTRODUODENOSCOPY     for penny removal    NO PAST SURGERIES     TYMPANOSTOMY TUBE PLACEMENT     WISDOM TOOTH EXTRACTION         Home Medications    Prior to Admission medications   Medication Sig Start Date End Date Taking? Authorizing Provider  oseltamivir (TAMIFLU) 75 MG capsule Take 1 capsule (75 mg total) by mouth every 12 (twelve) hours. 04/09/23  Yes Particia Nearing, PA-C  predniSONE (DELTASONE) 20 MG tablet Take 2 tablets (40 mg total) by mouth daily with breakfast. 04/09/23  Yes Particia Nearing, PA-C  promethazine-dextromethorphan (PROMETHAZINE-DM) 6.25-15  MG/5ML syrup Take 5 mLs by mouth 4 (four) times daily as needed. 04/09/23  Yes Particia Nearing, PA-C  albuterol (VENTOLIN HFA) 108 (90 Base) MCG/ACT inhaler Inhale 2 puffs into the lungs every 4 (four) hours as needed for wheezing or shortness of breath. 09/10/19   Freddy Finner, NP  FLUoxetine (PROZAC) 20 MG capsule TAKE THREE CAPSULES BY MOUTH EVERY DAY 01/22/23   Billie Lade, MD  fluticasone-salmeterol (ADVAIR) 250-50 MCG/ACT AEPB INHALE 1 PUFF TWICE DAILY (IN THE MORNING AND AT BEDTIME) 11/13/22   Billie Lade, MD  loratadine (CLARITIN) 10 MG tablet Take 1 tablet (10 mg total) by mouth daily. 08/18/20   Kerri Perches, MD  promethazine-dextromethorphan (PROMETHAZINE-DM) 6.25-15 MG/5ML syrup Take 2.5 mLs by mouth 4 (four) times daily as needed for cough. 04/27/22   Billie Lade, MD  VITAMIN D PO Take 10,000 Units by mouth daily.    [provider]  Vitamin D, Ergocalciferol, (DRISDOL) 1.25 MG (50000 UNIT) CAPS capsule Take 1 capsule (50,000 Units total) by mouth every 7 (seven) days for 12 doses. 02/28/23 05/17/23  Billie Lade, MD    Family History Family History  Problem Relation Age of Onset   Allergic rhinitis Mother    Asthma Mother    Heart disease Mother    Depression Mother    Diabetes Mother    Hypertension Mother    Hyperlipidemia Mother    Miscarriages / [place] Mother    Cancer  Father        lung   Learning disabilities Father    COPD Father    Heart disease Sister 6       birth defect heart   Heart disease Maternal Grandmother    Cancer Maternal Grandfather        lung cancer to brain   Heart disease Paternal Grandmother    Angioedema Neg Hx    Atopy Neg Hx    Eczema Neg Hx    Immunodeficiency Neg Hx    Urticaria Neg Hx     Social History Social History   Tobacco Use   Smoking status: Never   Smokeless tobacco: Never  Vaping Use   Vaping status: Never Used  Substance Use Topics   Alcohol use: Yes   Drug use: No      Allergies   Patient has no known allergies.   Review of Systems Review of Systems Per HPI  Physical Exam Triage Vital Signs ED Triage Vitals  Encounter Vitals Group     BP 04/09/23 1118 122/82     Systolic BP Percentile --      Diastolic BP Percentile --      Pulse Rate 04/09/23 1118 77     Resp 04/09/23 1118 20     Temp 04/09/23 1118 97.8 F (36.6 C)     Temp Source 04/09/23 1118 Oral     SpO2 04/09/23 1118 96 %     Weight --      Height --      Head Circumference --      Peak Flow --      Pain Score 04/09/23 1123 0     Pain Loc --      Pain Education --      Exclude from Growth Chart --    No data found.  Updated Vital Signs BP 122/82 (BP Location: Right Arm)   Pulse 77   Temp 97.8 F (36.6 C) (Oral)   Resp 20   SpO2 96%   Visual Acuity Right Eye Distance:   Left Eye Distance:   Bilateral Distance:    Right Eye Near:   Left Eye Near:    Bilateral Near:     Physical Exam Vitals and nursing note reviewed.  Constitutional:      Appearance: He is well-developed.  HENT:     Head: Atraumatic.     Right Ear: External ear normal.     Left Ear: External ear normal.     Nose: Rhinorrhea present.     Mouth/Throat:     Pharynx: Posterior oropharyngeal erythema present. No oropharyngeal exudate.  Eyes:     Conjunctiva/sclera: Conjunctivae normal.     Pupils: Pupils are equal, round, and reactive to light.  Cardiovascular:     Rate and Rhythm: Normal rate and regular rhythm.  Pulmonary:     Effort: Pulmonary effort is normal. No respiratory distress.     Breath sounds: Wheezing present. No rales.  Musculoskeletal:        General: Normal range of motion.     Cervical back: Normal range of motion and neck supple.  Lymphadenopathy:     Cervical: No cervical adenopathy.  Skin:    General: Skin is warm and dry.  Neurological:     Mental Status: He is alert and oriented to person, place, and time.  Psychiatric:        Behavior: Behavior normal.       UC Treatments / Results  Labs (all labs ordered are listed, but only abnormal results are displayed) Labs Reviewed  POC COVID19/FLU A&B COMBO - Abnormal; Notable for the following components:      Result Value   Influenza A Antigen, POC Positive (*)    All other components within normal limits    EKG   Radiology No results found.  Procedures Procedures (including critical care time)  Medications Ordered in UC Medications - No data to display  Initial Impression / Assessment and Plan / UC Course  I have reviewed the triage vital signs and the nursing notes.  Pertinent labs & imaging results that were available during my care of the patient were reviewed by me and considered in my medical decision making (see chart for details).     Rapid flu positive for influenza A, suspect also asthma exacerbation secondary to this.  Treat with Tamiflu, prednisone, Phenergan DM, supportive over-the-counter medications and home care, albuterol as needed.  Return for worsening symptoms.  Final Clinical Impressions(s) / UC Diagnoses   Final diagnoses:  Influenza A  Mild intermittent asthma with acute exacerbation   Discharge Instructions   None    ED Prescriptions     Medication Sig Dispense Auth. Provider   oseltamivir (TAMIFLU) 75 MG capsule Take 1 capsule (75 mg total) by mouth every 12 (twelve) hours. 10 capsule Particia Nearing, PA-C   predniSONE (DELTASONE) 20 MG tablet Take 2 tablets (40 mg total) by mouth daily with breakfast. 10 tablet Particia Nearing, PA-C   promethazine-dextromethorphan (PROMETHAZINE-DM) 6.25-15 MG/5ML syrup Take 5 mLs by mouth 4 (four) times daily as needed. 100 mL Particia Nearing, [place]      PDMP not reviewed this encounter.   Particia Nearing, [place] 04/13/23 1924

## 2023-05-03 ENCOUNTER — Other Ambulatory Visit: Payer: Self-pay | Admitting: Internal Medicine

## 2023-05-03 DIAGNOSIS — J4530 Mild persistent asthma, uncomplicated: Secondary | ICD-10-CM

## 2023-05-31 ENCOUNTER — Other Ambulatory Visit: Payer: Self-pay | Admitting: Internal Medicine

## 2023-08-30 ENCOUNTER — Other Ambulatory Visit: Payer: Self-pay | Admitting: Internal Medicine

## 2023-08-30 DIAGNOSIS — J4530 Mild persistent asthma, uncomplicated: Secondary | ICD-10-CM

## 2023-11-06 ENCOUNTER — Other Ambulatory Visit: Payer: Self-pay | Admitting: Internal Medicine

## 2023-12-05 ENCOUNTER — Other Ambulatory Visit (INDEPENDENT_AMBULATORY_CARE_PROVIDER_SITE_OTHER): Payer: Self-pay

## 2023-12-05 ENCOUNTER — Encounter: Payer: Self-pay | Admitting: Orthopedic Surgery

## 2023-12-05 ENCOUNTER — Ambulatory Visit: Admitting: Orthopedic Surgery

## 2023-12-05 VITALS — BP 131/87 | HR 78 | Ht 66.0 in | Wt 206.0 lb

## 2023-12-05 DIAGNOSIS — M67471 Ganglion, right ankle and foot: Secondary | ICD-10-CM | POA: Diagnosis not present

## 2023-12-05 DIAGNOSIS — R2241 Localized swelling, mass and lump, right lower limb: Secondary | ICD-10-CM

## 2023-12-05 NOTE — Progress Notes (Signed)
 New Patient Visit  Assessment: Jimmy Landry is a 23 y.o. male with the following: 1. Ganglion cyst of right foot   Plan: Romney R Lafontant has a ganglion cyst in the right foot.  No overlying skin issues.  He does create some issues in his work boots.  We discussed multiple treatment options, and he elected to proceed with an aspiration.  This was completed in clinic today.  There were no issues.  He will return to clinic as needed.   Procedure note - Ganglion Cyst Aspiration - Dorsal Right foot  Verbal consent was obtained to aspirate a ganglion cyst on the medial aspect of the right first MTP joint Timeout was completed to confirm the site of aspiration. The skin was prepped with alcohol and ethyl chloride was sprayed at the injection site.  An 18-gauge needle was used to aspirate 2 cc of thick gelatinous cyst material. There were no complications.  A sterile bandage was applied.   Follow-up: Return if symptoms worsen or fail to improve.  Subjective:  Chief Complaint  Patient presents with   Foot Pain    Has a knot on top bothers me wearing a shoe aggravating but not really painful unless I touch it.    History of Present Illness: Jimmy Landry is a 23 y.o. male who presents for evaluation of right foot pain.  He states he has a small bump on the dorsal aspect of the right foot.  Not sure how long it has been there.  It is not painful unless he touches it.  It is aggravated while he wears his work boots.  No specific injury.  He has not tried anything for it yet.   Review of Systems: No fevers or chills No numbness or tingling No chest pain No shortness of breath No bowel or bladder dysfunction No GI distress No headaches   Medical History:  Past Medical History:  Diagnosis Date   Allergy    Anxiety    Asthma    Depression, major, single episode, mild 12/21/2016   Insomnia 12/21/2016   Otic fungal infection 03/13/2019    Past Surgical History:  Procedure  Laterality Date   ESOPHAGOGASTRODUODENOSCOPY     for penny removal    NO PAST SURGERIES     TYMPANOSTOMY TUBE PLACEMENT     WISDOM TOOTH EXTRACTION      Family History  Problem Relation Age of Onset   Allergic rhinitis Mother    Asthma Mother    Heart disease Mother    Depression Mother    Diabetes Mother    Hypertension Mother    Hyperlipidemia Mother    Miscarriages / Stillbirths Mother    Cancer Father        lung   Learning disabilities Father    COPD Father    Heart disease Sister 6       birth defect heart   Heart disease Maternal Grandmother    Cancer Maternal Grandfather        lung cancer to brain   Heart disease Paternal Grandmother    Angioedema Neg Hx    Atopy Neg Hx    Eczema Neg Hx    Immunodeficiency Neg Hx    Urticaria Neg Hx    Social History   Tobacco Use   Smoking status: Never   Smokeless tobacco: Never  Vaping Use   Vaping status: Never Used  Substance Use Topics   Alcohol use: Yes   Drug use: No  No Known Allergies  Current Meds  Medication Sig   albuterol  (VENTOLIN  HFA) 108 (90 Base) MCG/ACT inhaler Inhale 2 puffs into the lungs every 4 (four) hours as needed for wheezing or shortness of breath.   FLUoxetine  (PROZAC ) 20 MG capsule TAKE THREE CAPSULES BY MOUTH EVERY DAY   fluticasone -salmeterol (ADVAIR) 250-50 MCG/ACT AEPB INHALE 1 PUFF TWICE DAILY (IN THE MORNING AND AT BEDTIME)   loratadine  (CLARITIN ) 10 MG tablet Take 1 tablet (10 mg total) by mouth daily.   Current Facility-Administered Medications for the 12/05/23 encounter (Office Visit) with Onesimo Oneil LABOR, MD  Medication   polyethylene glycol powder (GLYCOLAX /MIRALAX ) container 255 g    Objective: BP 131/87   Pulse 78   Ht 5' 6 (1.676 m)   Wt 206 lb (93.4 kg)   BMI 33.25 kg/m   Physical Exam:  General: Alert and oriented. and No acute distress. Gait: Normal gait.  Visible growth on the dorsal and medial aspect of the right first MTP joint.  Mass is firm but  compressible.  No overlying skin adherence.  No redness.  Negative Tinel's.  Sensation is intact distally.  IMAGING: I personally ordered and reviewed the following images   X-ray of the right foot were obtained in clinic today.  No acute injuries noted.  No dislocation. Normal overall alignment.  These nonweightbearing views.  No bony lesions.  Impression: Normal right foot x-ray   New Medications:  No orders of the defined types were placed in this encounter.     Oneil LABOR Onesimo, MD  12/05/2023 10:50 AM

## 2024-02-28 ENCOUNTER — Encounter: Payer: BC Managed Care – PPO | Admitting: Internal Medicine
